# Patient Record
Sex: Female | Born: 1955 | Race: Black or African American | Hispanic: No | Smoking: Former smoker
Health system: Southern US, Community
[De-identification: ages and names within clinical notes are randomized; demographics above are authoritative.]

## PROBLEM LIST (undated history)

## (undated) DIAGNOSIS — K219 Gastro-esophageal reflux disease without esophagitis: Secondary | ICD-10-CM

## (undated) DIAGNOSIS — F329 Major depressive disorder, single episode, unspecified: Secondary | ICD-10-CM

## (undated) DIAGNOSIS — Z9289 Personal history of other medical treatment: Secondary | ICD-10-CM

## (undated) DIAGNOSIS — M797 Fibromyalgia: Secondary | ICD-10-CM

## (undated) DIAGNOSIS — F32A Depression, unspecified: Secondary | ICD-10-CM

## (undated) HISTORY — PX: OTHER SURGICAL HISTORY: SHX169

## (undated) HISTORY — PX: CHOLECYSTECTOMY: SHX55

## (undated) HISTORY — PX: ABDOMINAL HYSTERECTOMY: SHX81

---

## 1998-03-03 ENCOUNTER — Ambulatory Visit (HOSPITAL_COMMUNITY): Admission: RE | Admit: 1998-03-03 | Discharge: 1998-03-03 | Payer: Self-pay | Admitting: *Deleted

## 2000-11-20 ENCOUNTER — Encounter: Payer: Self-pay | Admitting: Emergency Medicine

## 2000-11-20 ENCOUNTER — Emergency Department (HOSPITAL_COMMUNITY): Admission: EM | Admit: 2000-11-20 | Discharge: 2000-11-20 | Payer: Self-pay | Admitting: Emergency Medicine

## 2004-10-28 ENCOUNTER — Emergency Department (HOSPITAL_COMMUNITY): Admission: EM | Admit: 2004-10-28 | Discharge: 2004-10-29 | Payer: Self-pay | Admitting: Emergency Medicine

## 2005-06-14 ENCOUNTER — Other Ambulatory Visit: Admission: RE | Admit: 2005-06-14 | Discharge: 2005-06-14 | Payer: Self-pay | Admitting: Obstetrics and Gynecology

## 2005-06-20 ENCOUNTER — Inpatient Hospital Stay (HOSPITAL_COMMUNITY): Admission: RE | Admit: 2005-06-20 | Discharge: 2005-06-22 | Payer: Self-pay | Admitting: Obstetrics and Gynecology

## 2005-06-20 ENCOUNTER — Encounter (INDEPENDENT_AMBULATORY_CARE_PROVIDER_SITE_OTHER): Payer: Self-pay | Admitting: *Deleted

## 2005-10-28 ENCOUNTER — Emergency Department (HOSPITAL_COMMUNITY): Admission: EM | Admit: 2005-10-28 | Discharge: 2005-10-28 | Payer: Self-pay | Admitting: Emergency Medicine

## 2005-12-13 ENCOUNTER — Emergency Department (HOSPITAL_COMMUNITY): Admission: EM | Admit: 2005-12-13 | Discharge: 2005-12-13 | Payer: Self-pay | Admitting: Emergency Medicine

## 2005-12-19 ENCOUNTER — Encounter: Admission: RE | Admit: 2005-12-19 | Discharge: 2005-12-19 | Payer: Self-pay | Admitting: Gastroenterology

## 2006-01-30 ENCOUNTER — Encounter: Admission: RE | Admit: 2006-01-30 | Discharge: 2006-01-30 | Payer: Self-pay | Admitting: Obstetrics and Gynecology

## 2007-06-13 ENCOUNTER — Encounter: Admission: RE | Admit: 2007-06-13 | Discharge: 2007-06-13 | Payer: Self-pay | Admitting: Gastroenterology

## 2008-01-30 ENCOUNTER — Emergency Department (HOSPITAL_COMMUNITY): Admission: EM | Admit: 2008-01-30 | Discharge: 2008-01-30 | Payer: Self-pay | Admitting: Emergency Medicine

## 2011-01-07 ENCOUNTER — Emergency Department (HOSPITAL_COMMUNITY): Payer: BC Managed Care – PPO

## 2011-01-07 ENCOUNTER — Emergency Department (HOSPITAL_COMMUNITY)
Admission: EM | Admit: 2011-01-07 | Discharge: 2011-01-07 | Disposition: A | Discharge status: Home / Self Care | Payer: BC Managed Care – PPO | Attending: Emergency Medicine | Admitting: Emergency Medicine

## 2011-01-07 DIAGNOSIS — M25469 Effusion, unspecified knee: Secondary | ICD-10-CM | POA: Insufficient documentation

## 2011-01-07 DIAGNOSIS — M25569 Pain in unspecified knee: Secondary | ICD-10-CM | POA: Insufficient documentation

## 2011-02-19 ENCOUNTER — Other Ambulatory Visit: Payer: Self-pay | Admitting: Orthopedic Surgery

## 2011-02-19 DIAGNOSIS — M545 Low back pain: Secondary | ICD-10-CM

## 2011-02-26 ENCOUNTER — Other Ambulatory Visit: Payer: BC Managed Care – PPO

## 2011-04-06 NOTE — H&P (Signed)
[name], [name]              ACCOUNT NO.:  [id number]   MEDICAL RECORD NO.:  [id number]          PATIENT TYPE:  INP   LOCATION:  NA                            [facility]  [facility]   PHYSICIAN:  Dois Davenport A. Rivard, M.D. DATE OF BIRTH:  [birth year]   DATE OF ADMISSION:  06/20/2005  DATE OF DISCHARGE:                                HISTORY & PHYSICAL   REASON FOR ADMISSION:  Symptomatic uterine fibroids.   HISTORY OF PRESENT ILLNESS:  This is a 55 year old single black female who  is seen by me with complaints of worsening uterine fibroids.  She currently  reports menstrual cycles every 21-30 days, with a duration of five to seven  days and heavy flow for four days.  That flow requires two pads every 1-1/2  hours, and she reports passing clots as large as 4 cm with gushes of  menstrual flow.  She also is complaining of severe dysmenorrhea, intensity  8/10, which requires ibuprofen 600 mg every fours.  She denies any  breakthrough bleeding, dyspareunia or postcoital bleeding.  She also is  reporting low back pain that is worsening over the last few months and has  urinary frequency but denies any urinary stress incontinence.  She complains  of low bladder capacity, which also has been worsening.   An ultrasound performed at [facility] in November 2005 revealed  an overall uterine size of 19 x 12 cm.  There was a large, partially  necrotic fundal fibroid measuring 6 cm, and a peripheral right-sided fundal  fibroid measuring 7 cm.  Another pedunculated right-sided fibroid measured 4  cm.  Both ovaries were normal except for containing small follicles.  There  was no evidence of hydronephrosis.  Lab work performed also at this time  revealed hemoglobin of 12.4, hematocrit decreased at 36.4, urinalysis is  negative, and GC/Chlamydia were negative.   REVIEW OF SYSTEMS:  CONSTITUTIONAL:  Negative.  HEAD, EYES, EARS, NOSE AND  THROAT:  Negative.  CARDIORESPIRATORY:  Negative.   GASTROINTESTINAL:  Negative.  GENITOURINARY:  Negative except for low bladder capacity and  urinary frequency.  PSYCHIATRIC:  Negative.  NEUROLOGIC:  Negative.   PAST MEDICAL HISTORY:  1.  Status post cholecystectomy.  2.  Currently with a left torn rotator cuff, awaiting surgical management.  3.  Status post tubal ligation.   The patient is a gravida 4, para 3, with three previous spontaneous vaginal  deliveries.   DRUG ALLERGIES:  PERCOCET, with severe nausea.   CURRENT MEDICATION USED:  Motrin.   SOCIAL HISTORY:  The patient is a Engineer, civil (consulting).  Lives with her 62 year old son.  Has a 71 and a 82 year old daughter living in [place].  She is an  occasional smoker.   FAMILY HISTORY:  Positive for diabetes, positive for hypertension.  Negative  for feminine cancer.  Negative for colon cancer.   PHYSICAL EXAMINATION:  VITAL SIGNS:  Current weight is 203 pounds for a  height of 5 feet 6-1/2 inches.  Blood pressure 110/80.  HEENT:  Normal.  NECK:  Thyroid not enlarged.  CARDIAC:  Regular rate and rhythm.  CHEST:  Clear.  BREASTS:  Normal.  BACK:  No CVA tenderness.  ABDOMEN:  Firm masses from pelvis to the level of the umbilicus, mainly on  the right side, with some tenderness.  No hepatosplenomegaly.  EXTREMITIES:  Negative.  NEUROLOGIC:  Negative.  PELVIC:  Normal external genitalia.  Normal vagina.  Normal cervix.  Pap  smear is collected on June 14, 2005.  Uterus is increased in size, 20-22  weeks, irregular, tender, mobile.  Adnexa are not felt due to the size of  the uterus, and rectovaginal exam is normal.   ASSESSMENT:  Symptomatic large fibroids with pelvic pain and menorrhagia in  patient desiring surgical management.   PLAN:  The patient will undergo total abdominal hysterectomy with  preservation of ovaries per patient request.  The procedure has been  thoroughly reviewed with Ms. Mcdermott, who understands the procedure as well  as the possible risks, including but not  limited to bleeding, infection,  injury to bowels, bladder or ureters.  Hospital stay and recovery have also  been discussed with Ms. Sosinski.       SAR/MEDQ  D:  06/14/2005  T:  06/14/2005  Job:  [id number]

## 2011-04-06 NOTE — Op Note (Signed)
[name], [name]              ACCOUNT NO.:  [id number]   MEDICAL RECORD NO.:  [id number]          PATIENT TYPE:  INP   LOCATION:  9399                          [facility]ACILITY:  [facility]   PHYSICIAN:  Crist Fat. Rivard, M.D. DATE OF BIRTH:  10-01-1956   DATE OF PROCEDURE:  06/20/2005  DATE OF DISCHARGE:                                 OPERATIVE REPORT   PREOPERATIVE DIAGNOSIS:  Uterine fibroids with pelvic pain.   POSTOPERATIVE DIAGNOSIS:  Uterine fibroids and endometriosis.   ANESTHESIA:  General.   PROCEDURE:  Total abdominal hysterectomy.   SURGEON:  Crist Fat. Rivard, M.D.   ASSISTANTMarquis Lunch. Lowell Guitar, P.A.   ESTIMATED BLOOD LOSS:  200 mL.   DESCRIPTION OF PROCEDURE:  After being informed of the planned procedure  with possible complications including bleeding, infection, injury to bowel,  bladder.  Informed consent was obtained.  The patient was taken to OR#3  given general anesthesia with endotracheal intubation without complications.  She was placed in the dorsal decubitus position, prepped and draped in a  sterile fashion, and a Foley catheter was inserted in her bladder.  She  received Ancef 2 grams IV.   The suprapubic area was infiltrated with 20 mL of Marcaine 0.25% and we  performed a Pfannenstiel incision which was brought down sharply to the  fascia.  The fascia was then incised in a Pfannenstiel way.  Linea alba was  dissected and peritoneum was entered bluntly and extended medially.  At this  time, we noted a fascial defect in the umbilical area which we will repair  upon closure.  Also noted is a small adhesion between the omentum and the  anterior fascia which is sectioned with cautery.   Observation:  Appendix is normal.  Liver is smooth.  No retroperitoneal  lymph nodes.  The uterus is bulky, 20 to 22 weeks in size with multiple  pedunculated fibroids.  Both ovaries appear normal, although displaced by  the enlarged uterus.  Anterior and posterior cul-de-sac  feels smooth.   The uterus is easily exteriorized.  Self-retaining retractors are placed and  bowels are packed with abdominal packs.   We are able to suture with a transfixing suture of 0 Vicryl, both round  ligaments and section them.  This allows [place] opening of the broad ligament  and blunt dissection downward of the bladder.  This is achieved easily.   We then freed the adnexa from the uterus by clamping the utero-ovarian  ligament with tube using Rogers forceps.  These pedicles are sectioned and  sutured with the transfixing suture of 0 Vicryl.  We are able to then  skeletonize the uterine vessels with Metzenbaum scissors and clamp those  vessels with Rogers forceps after identifying location of both ureters away  from the site of clamping.  Those pedicles are sectioned and sutured with 0  Vicryl.  A small amount of cardinal ligament is then clamped on each side,  sectioned, and sutured with double suturing the uterine pedicles using 0  Vicryl.  We are then able to remove the body of the uterus from the  cervix  which allows [place] a much better evaluation of the pelvic cavity.  The bladder  is pushed back down somewhat more.  Cardinal ligaments are isolated with  Rogers forceps, sectioned, and sutured with a transfixing suture of 0  Vicryl.  Uterosacral ligaments are identified, clamped with Rogers clamp,  sectioned, and sutured with a transfixing suture of 0 Vicryl, checked for  future suspension.  The vagina is then entered and the cervix is removed  completely using Satinsky scissors.  Both vaginal angles are then sutured  with 0 Vicryl attached to the corresponding uterosacral ligament for  suspension.  Anterior and posterior vaginal edges are then sutured  hemostatically with a running locked suture of 0 Vicryl and the vagina is  closed with a figure-of-eight stitch of 0 Vicryl.  All pedicles are then  systematically inspected.  Hemostasis is completed on the right tubo-ovarian   ligament with a free suture of 0 Vicryl.  Both ureters are identified  visually, appear normal with normal peristalsis.  We then irrigate profusely  the pelvis with warm saline.  Hemostasis is rechecked and adequate.  All  sponges are removed.  Retractors are removed.  Under fascia hemostasis is  completed with the cautery.  Umbilical defect is closed with four  interrupted sutures of 0 Vicryl.  The fascia is then closed with two running  locked suture of 1 Vicryl meeting in the midline.  The wound is irrigated  with warm saline and the skin is closed with a subcuticular suture of 3-0  Monocryl and Steri-Strips.   Needle, sponge, and instrument counts correct x2.  Estimated blood loss is  200 mL.  The procedure is very well tolerated by the patient who is taken to  the recovery room in a well and stable condition.       SAR/MEDQ  D:  06/20/2005  T:  06/20/2005  Job:  [id number]

## 2011-04-06 NOTE — Discharge Summary (Signed)
[name], STANBACK              ACCOUNT NO.:  [id number]   MEDICAL RECORD NO.:  [id number]          PATIENT TYPE:  INP   LOCATION:  9315                          [facility]ILITY:  [facility]   PHYSICIAN:  Crist Fat. Rivard, M.D. DATE OF BIRTH:  1955/12/15   DATE OF ADMISSION:  06/20/2005  DATE OF DISCHARGE:  06/22/2005                                 DISCHARGE SUMMARY   DISCHARGE DIAGNOSES:  1.  Symptomatic uterine fibroids.  2.  Pelvic pain.  3.  Menorrhagia.  4.  Pelvic adhesions.  5.  Umbilical hernia.  6.  Probable endometriosis.  7.  Anxiety.   OPERATION:  On the day of admission, the patient underwent a total abdominal  hysterectomy with lysis of adhesions and repair of an umbilical hernia,  tolerating procedure well.  The patient was found to have a multiple fibroid  uterus, weighing 1077 grams, along with a left paratubal cyst, and normal-  appearing right tube, right ovary, and left ovary.  The patient also was  found to have an approximately 2-cm subumbilical abdominal wall defect as  well as stigmata consistent with endometriosis on the anterior portion of  her uterus.   HISTORY OF PRESENT ILLNESS:  Ms. [name] is a 55 year old, single, black  female who presents for hysterectomy because of symptomatic uterine fibroids  and pelvic pain.  Please see the patient's dictated history and physical  examination for details.   PREOPERATIVE PHYSICAL EXAM:  VITAL SIGNS:  Blood pressure 110/80, weight is  203 pounds, height is 5 feet, 6.5 inches tall.  GENERAL:  Within normal limits.  ABDOMEN:  The patient had a firm mass which was arising from the pelvis to  the level of umbilicus, primarily on the right side with some tenderness.  However, there was no organomegaly.  PELVIC:  Normal external genitalia.  Normal vagina.  Normal cervix.  Uterus  was increased to 20-22 weeks size, irregular, tender, and mobile.  Adnexa  was not felt due to the size of the uterus and rectovaginal exam is  normal.   HOSPITAL COURSE:  On the day of admission, the patient underwent  aforementioned procedures, tolerating them well.  Postoperative course was  unremarkable with the exception of the patient experiencing a single episode  of an anxiety attack on post-op day #1. The patient's episode resolved  spontaneously after approximately 15 minutes, and the patient was free of  this activity for the remainder of her hospital stay.  Post-op hemoglobin was 10.7 (pre-op hemoglobin 12.6).  By post-op day #2,  the patient had resumed bowel and bladder function and therefore, was deemed  ready for discharge home.   DISCHARGE MEDICATIONS:  1.  Ferrous sulfate 325 mg, one tablet twice daily for 6 weeks.  2.  Ibuprofen 600 mg, one tablet with food every 6 hours for three days,      then as needed for pain.  3.  Colace 100 mg, one tablet twice daily until bowel movements are regular.  4.  Phenergan 25 mg, one tablet every 6 hours as needed for nausea.  5.  Dilaudid 2 mg, 1-2  tablets every 4-6 hours as needed for severe pain.  6.  Xanax 0.5 mg, one tablet three times daily as needed.   FOLLOW-UP:  The patient is scheduled for six-week postoperative visit with  Dr. Estanislado Pandy on July 31, 2005 at 1:45 p.m.   DISCHARGE INSTRUCTIONS:  1.  The patient was given a copy of [facility] postoperative      instruction sheet.  2.  She was further advised to avoid driving for two weeks, heavy lifting      for four weeks, and intercourse for six weeks.  3.  The patient was also advised to follow up with her family physician for      her anxiety episodes.   DIET:  Without restriction.   FINAL PATHOLOGY:  Not available at the time of the patient's discharge.       EJP/MEDQ  D:  06/22/2005  T:  06/22/2005  Job:  [id number]

## 2012-12-10 DIAGNOSIS — Z9289 Personal history of other medical treatment: Secondary | ICD-10-CM

## 2012-12-10 HISTORY — DX: Personal history of other medical treatment: Z92.89

## 2013-03-17 ENCOUNTER — Emergency Department (HOSPITAL_COMMUNITY)
Admission: EM | Admit: 2013-03-17 | Discharge: 2013-03-17 | Disposition: A | Discharge status: Home / Self Care | Payer: 59 | Attending: Emergency Medicine | Admitting: Emergency Medicine

## 2013-03-17 ENCOUNTER — Encounter (HOSPITAL_COMMUNITY): Payer: Self-pay | Admitting: Emergency Medicine

## 2013-03-17 ENCOUNTER — Emergency Department (HOSPITAL_COMMUNITY): Payer: 59

## 2013-03-17 DIAGNOSIS — F3289 Other specified depressive episodes: Secondary | ICD-10-CM | POA: Insufficient documentation

## 2013-03-17 DIAGNOSIS — F329 Major depressive disorder, single episode, unspecified: Secondary | ICD-10-CM | POA: Insufficient documentation

## 2013-03-17 DIAGNOSIS — IMO0001 Reserved for inherently not codable concepts without codable children: Secondary | ICD-10-CM | POA: Insufficient documentation

## 2013-03-17 DIAGNOSIS — K219 Gastro-esophageal reflux disease without esophagitis: Secondary | ICD-10-CM | POA: Insufficient documentation

## 2013-03-17 DIAGNOSIS — F17200 Nicotine dependence, unspecified, uncomplicated: Secondary | ICD-10-CM | POA: Insufficient documentation

## 2013-03-17 DIAGNOSIS — Z79899 Other long term (current) drug therapy: Secondary | ICD-10-CM | POA: Insufficient documentation

## 2013-03-17 DIAGNOSIS — F411 Generalized anxiety disorder: Secondary | ICD-10-CM | POA: Insufficient documentation

## 2013-03-17 DIAGNOSIS — R079 Chest pain, unspecified: Secondary | ICD-10-CM

## 2013-03-17 DIAGNOSIS — Z91040 Latex allergy status: Secondary | ICD-10-CM | POA: Insufficient documentation

## 2013-03-17 DIAGNOSIS — R0789 Other chest pain: Secondary | ICD-10-CM | POA: Insufficient documentation

## 2013-03-17 HISTORY — DX: Fibromyalgia: M79.7

## 2013-03-17 HISTORY — DX: Depression, unspecified: F32.A

## 2013-03-17 HISTORY — DX: Major depressive disorder, single episode, unspecified: F32.9

## 2013-03-17 LAB — CBC
Hemoglobin: 12.3 g/dL (ref 12.0–15.0)
MCH: 29.2 pg (ref 26.0–34.0)
MCV: 86.5 fL (ref 78.0–100.0)
Platelets: 336 10*3/uL (ref 150–400)
RDW: 13 % (ref 11.5–15.5)

## 2013-03-17 LAB — POCT I-STAT TROPONIN I: Troponin i, poc: 0 ng/mL (ref 0.00–0.08)

## 2013-03-17 LAB — COMPREHENSIVE METABOLIC PANEL
CO2: 31 mEq/L (ref 19–32)
Chloride: 101 mEq/L (ref 96–112)
GFR calc Af Amer: >90 mL/min (ref >90)
GFR calc non Af Amer: >90 mL/min (ref >90)
Glucose, Bld: 90 mg/dL (ref 70–99)
Sodium: 139 mEq/L (ref 135–145)

## 2013-03-17 MED ORDER — OMEPRAZOLE 20 MG PO CPDR: 20.0000 mg | DELAYED_RELEASE_CAPSULE | Freq: Every day | ORAL | Status: DC

## 2013-03-17 NOTE — ED Notes (Signed)
Stopped taking her bp meds about 2-3 months ago dr is aware she states and she started to take a wt loss supp also

## 2013-03-17 NOTE — ED Notes (Signed)
This am woke up w/ bad h/a and then she lasd down woke up and her bp was high and then she had cpressure. Left arm tingled Took a xaxax  Now pain is a 2

## 2013-03-17 NOTE — ED Provider Notes (Signed)
I saw and evaluated the patient, reviewed the resident's note and I agree with the findings and plan. On my exam this patient was in no distress.  The patient is minimal risk factors for ACS, and in the emergency department had unremarkable ECG, 2 negative troponins. She improved while here as well. The patient endorses noncompliance with Prilosec, which is likely contributory to this presentation.  She was counseled on the need to resume this medication.  She was discharged in stable condition.  I saw the ECG, relevant labs and studies - I agree with the interpretation.   Gerhard Munch, MD 03/17/13 2008

## 2013-03-17 NOTE — ED Provider Notes (Signed)
History     CSN: [id number]  Arrival date & time 03/17/13  1441   First MD Initiated Contact with Patient 03/17/13 1659      Chief Complaint  Patient presents with  . Chest Pain    (Consider location/radiation/quality/duration/timing/severity/associated sxs/prior treatment) Patient is a 57 y.o. female presenting with chest pain. The history is provided by the patient.  Chest Pain Pain location:  Substernal area and L chest Pain quality: aching and dull   Pain radiates to:  L shoulder Pain radiates to the back: no   Pain severity:  Mild Onset quality:  Sudden Duration:  1 minute Timing:  Constant Progression:  Resolved Chronicity:  New Context comment:  Stress, anxiety Associated symptoms: no abdominal pain, no cough, no fever, no palpitations, no shortness of breath and not vomiting     Past Medical History  Diagnosis Date  . Depression   . Fibromyalgia     No past surgical history on file.  No family history on file.  History  Substance Use Topics  . Smoking status: Light Tobacco Smoker  . Smokeless tobacco: Not on file  . Alcohol Use: Not on file    OB History   Grav Para Term Preterm Abortions TAB SAB Ect Mult Living                  Review of Systems  Constitutional: Negative for fever, chills, activity change and appetite change.  HENT: Negative for neck pain and neck stiffness.   Respiratory: Negative for cough, chest tightness, shortness of breath and wheezing.   Cardiovascular: Negative for chest pain, palpitations and leg swelling.  Gastrointestinal: Negative for vomiting, abdominal pain, diarrhea and constipation.  Genitourinary: Negative for dysuria, decreased urine volume and difficulty urinating.  Skin: Negative for rash and wound.  Neurological: Negative for syncope and light-headedness.  Psychiatric/Behavioral: Negative for confusion and agitation.  All other systems reviewed and are negative.    Allergies  Latex and Morphine and  related  Home Medications   Current Outpatient Rx  Name  Route  Sig  Dispense  Refill  . ALPRAZolam (XANAX) 0.25 MG tablet   Oral   Take 0.25 mg by mouth daily as needed for anxiety.         . Cyanocobalamin (VITAMIN B-12 PO)   Oral   Take 1 tablet by mouth daily as needed ("when she feels down").         . DULoxetine (CYMBALTA) 30 MG capsule   Oral   Take 30 mg by mouth 2 (two) times daily.         Marland Kitchen GARCINIA CAMBOGIA-CHROMIUM PO   Oral   Take 1 capsule by mouth 2 (two) times daily.         Marland Kitchen ibuprofen (ADVIL,MOTRIN) 800 MG tablet   Oral   Take 800 mg by mouth 2 (two) times daily as needed for pain.         . Multiple Vitamin (MULTIVITAMIN WITH MINERALS) TABS   Oral   Take 1 tablet by mouth daily.         Marland Kitchen omeprazole (PRILOSEC) 20 MG capsule   Oral   Take 20 mg by mouth daily as needed (acid reflux).           BP 136/74  Pulse 111  Temp(Src) 98.3 F (36.8 C) (Oral)  SpO2 100%  Physical Exam  Nursing note and vitals reviewed. Constitutional: She is oriented to person, place, and time. She appears well-developed and  well-nourished.  HENT:  Head: Normocephalic and atraumatic.  Right Ear: External ear normal.  Left Ear: External ear normal.  Nose: Nose normal.  Mouth/Throat: Oropharynx is clear and moist. No oropharyngeal exudate.  Eyes: Conjunctivae are normal. Pupils are equal, round, and reactive to light.  Neck: Normal range of motion. Neck supple.  Cardiovascular: Normal rate, regular rhythm, normal heart sounds and intact distal pulses.  Exam reveals no gallop and no friction rub.   No murmur heard. Pulmonary/Chest: Effort normal and breath sounds normal. No respiratory distress. She has no wheezes. She has no rales. She exhibits no tenderness.  Abdominal: Soft. Bowel sounds are normal. She exhibits no distension and no mass. There is no tenderness. There is no rebound and no guarding.  Musculoskeletal: Normal range of motion. She exhibits no  edema and no tenderness.  Neurological: She is alert and oriented to person, place, and time. She displays normal reflexes. No cranial nerve deficit. She exhibits normal muscle tone. Coordination normal.  Skin: Skin is warm and dry.  Psychiatric: She has a normal mood and affect. Her behavior is normal. Judgment and thought content normal.    ED Course  Procedures (including critical care time)  Labs Reviewed  CBC - Abnormal; Notable for the following:    WBC 10.6 (*)    All other components within normal limits  COMPREHENSIVE METABOLIC PANEL  POCT I-STAT TROPONIN I  POCT I-STAT TROPONIN I   Dg Chest 2 View  03/17/2013  *RADIOLOGY REPORT*  Clinical Data: Chest pain and dizziness.  Smoker.  CHEST - 2 VIEW  Comparison: Chest CTA dated 10/28/2005.  Findings: Normal sized heart.  Clear lungs with normal vascularity. Mild thoracic spine degenerative changes.  Cholecystectomy clips.  IMPRESSION: No acute abnormality.   Original Report Authenticated By: Beckie Salts, M.D.      1. Chest pain       MDM  57 yo F w/hx of GERD, Fibromyalgia, and Anxiety presents after episode of left-sided chest pressure with radiation to left shoulder while she was thinking about work this morning. She states the pain felt different than her typical GERD and Prilosec did not resolve symptoms. However, symptoms did resolve with 0.5mg  Xanax. Asymptomatic here. EKG not c/w acute ischemia or arrythmia. Serial troponins negative. Clinical picture not concerning for PE, ACS, or aortic dissection. Suspect anxiety as cause of symptoms. Patient given return precautions, including worsening of signs or symptoms. Patient instructed to follow-up with primary care physician.           Clemetine Marker, MD 03/17/13 [phone]

## 2013-03-17 NOTE — ED Notes (Signed)
[name]- logged vitals.

## 2013-12-08 ENCOUNTER — Other Ambulatory Visit: Payer: Self-pay | Admitting: Orthopedic Surgery

## 2013-12-09 ENCOUNTER — Encounter (HOSPITAL_COMMUNITY): Payer: Self-pay | Admitting: Pharmacy Technician

## 2013-12-11 ENCOUNTER — Encounter (HOSPITAL_COMMUNITY): Payer: Self-pay

## 2013-12-11 ENCOUNTER — Encounter (HOSPITAL_COMMUNITY)
Admission: RE | Admit: 2013-12-11 | Discharge: 2013-12-11 | Disposition: A | Discharge status: Home / Self Care | Payer: 59 | Source: Ambulatory Visit | Attending: Orthopedic Surgery | Admitting: Orthopedic Surgery

## 2013-12-11 DIAGNOSIS — Z01812 Encounter for preprocedural laboratory examination: Secondary | ICD-10-CM | POA: Insufficient documentation

## 2013-12-11 DIAGNOSIS — Z01818 Encounter for other preprocedural examination: Secondary | ICD-10-CM | POA: Insufficient documentation

## 2013-12-11 HISTORY — DX: Gastro-esophageal reflux disease without esophagitis: K21.9

## 2013-12-11 HISTORY — DX: Personal history of other medical treatment: Z92.89

## 2013-12-11 LAB — URINALYSIS, ROUTINE W REFLEX MICROSCOPIC
BILIRUBIN URINE: NEGATIVE
Glucose, UA: NEGATIVE mg/dL
Hgb urine dipstick: NEGATIVE
Ketones, ur: NEGATIVE mg/dL
Leukocytes, UA: NEGATIVE
Nitrite: NEGATIVE
Protein, ur: NEGATIVE mg/dL
SPECIFIC GRAVITY, URINE: 1.033 — AB (ref 1.005–1.030)
Urobilinogen, UA: 0.2 mg/dL (ref 0.0–1.0)
pH: 5 (ref 5.0–8.0)

## 2013-12-11 LAB — COMPREHENSIVE METABOLIC PANEL
ALK PHOS: 83 U/L (ref 39–117)
ALT: 30 U/L (ref 0–35)
AST: 20 U/L (ref 0–37)
Albumin: 4 g/dL (ref 3.5–5.2)
BILIRUBIN TOTAL: 0.4 mg/dL (ref 0.3–1.2)
BUN: 11 mg/dL (ref 6–23)
CHLORIDE: 101 meq/L (ref 96–112)
CO2: 25 mEq/L (ref 19–32)
Calcium: 9.2 mg/dL (ref 8.4–10.5)
Creatinine, Ser: 0.64 mg/dL (ref 0.50–1.10)
GFR calc non Af Amer: >90 mL/min (ref >90)
GLUCOSE: 83 mg/dL (ref 70–99)
POTASSIUM: 3.7 meq/L (ref 3.7–5.3)
Sodium: 140 mEq/L (ref 137–147)
Total Protein: 7.4 g/dL (ref 6.0–8.3)

## 2013-12-11 LAB — CBC WITH DIFFERENTIAL/PLATELET
Basophils Absolute: 0 10*3/uL (ref 0.0–0.1)
Basophils Relative: 0 % (ref 0–1)
EOS ABS: 0.1 10*3/uL (ref 0.0–0.7)
Eosinophils Relative: 1 % (ref 0–5)
HCT: 37.9 % (ref 36.0–46.0)
HEMOGLOBIN: 12.8 g/dL (ref 12.0–15.0)
LYMPHS ABS: 3.5 10*3/uL (ref 0.7–4.0)
LYMPHS PCT: 37 % (ref 12–46)
MCH: 29.2 pg (ref 26.0–34.0)
MCHC: 33.8 g/dL (ref 30.0–36.0)
MCV: 86.5 fL (ref 78.0–100.0)
MONOS PCT: 9 % (ref 3–12)
Monocytes Absolute: 0.8 10*3/uL (ref 0.1–1.0)
NEUTROS ABS: 5.1 10*3/uL (ref 1.7–7.7)
NEUTROS PCT: 54 % (ref 43–77)
Platelets: 332 10*3/uL (ref 150–400)
RBC: 4.38 MIL/uL (ref 3.87–5.11)
RDW: 12.6 % (ref 11.5–15.5)
WBC: 9.6 10*3/uL (ref 4.0–10.5)

## 2013-12-11 LAB — ABO/RH: ABO/RH(D): A POS

## 2013-12-11 LAB — SURGICAL PCR SCREEN
MRSA, PCR: POSITIVE — AB
STAPHYLOCOCCUS AUREUS: POSITIVE — AB

## 2013-12-11 LAB — TYPE AND SCREEN
ABO/RH(D): A POS
Antibody Screen: NEGATIVE

## 2013-12-11 LAB — APTT: aPTT: 29 seconds (ref 24–37)

## 2013-12-11 LAB — PROTIME-INR
INR: 1 (ref 0.00–1.49)
Prothrombin Time: 13 seconds (ref 11.6–15.2)

## 2013-12-11 NOTE — Pre-Procedure Instructions (Signed)
Arminda ResidesDonna M Blancett  12/11/2013   Your procedure is scheduled on:  Friday, January 30  [facility] A at 0530 AM.  Call this number if you have problems the morning of surgery: [phone]   Remember:   Do not eat food or drink liquids after midnight.Thursday night   Take these medicines the morning of surgery with A SIP OF WATER: Omeprazole (Zegerid), Oxycodone if needed, Alprazalam (Xanax) if needed   Do not wear jewelry, make-up or nail polish.  Do not wear lotions, powders, or perfumes. You may wear deodorant.  Do not shave 48 hours prior to surgery.   Do not bring valuables to the hospital.  [facility] is not responsible   for any belongings or valuables.               Contacts, dentures or bridgework may not be worn into surgery.  Leave suitcase in the car. After surgery it may be brought to your room.  For patients admitted to the hospital, discharge time is determined by your                treatment team.     Special Instructions: Shower using CHG 2 nights before surgery and the night before surgery.  If you shower the day of surgery use CHG.  Use special wash - you have one bottle of CHG for all showers.  You should use approximately 1/3 of the bottle for each shower.   Please read over the following fact sheets that you were given: Pain Booklet, Coughing and Deep Breathing, Blood Transfusion Information, MRSA Information and Surgical Site Infection Prevention

## 2013-12-17 MED ORDER — CEFAZOLIN SODIUM-DEXTROSE 2-3 GM-% IV SOLR
2.0000 g | INTRAVENOUS | Status: AC
  Administered 2013-12-18: 2 g via INTRAVENOUS
  Filled 2013-12-17: qty 50

## 2013-12-18 ENCOUNTER — Encounter (HOSPITAL_COMMUNITY)
Admission: RE | Disposition: A | Discharge status: Home Health | Payer: Self-pay | Source: Ambulatory Visit | Attending: Orthopedic Surgery

## 2013-12-18 ENCOUNTER — Inpatient Hospital Stay (HOSPITAL_COMMUNITY): Payer: 59 | Admitting: Anesthesiology

## 2013-12-18 ENCOUNTER — Inpatient Hospital Stay (HOSPITAL_COMMUNITY)
Admission: RE | Admit: 2013-12-18 | Discharge: 2013-12-19 | DRG: 470 | Disposition: A | Discharge status: Home Health | Payer: 59 | Source: Ambulatory Visit | Attending: Orthopedic Surgery | Admitting: Orthopedic Surgery

## 2013-12-18 ENCOUNTER — Encounter (HOSPITAL_COMMUNITY): Payer: Self-pay | Admitting: *Deleted

## 2013-12-18 ENCOUNTER — Encounter (HOSPITAL_COMMUNITY): Payer: 59 | Admitting: Anesthesiology

## 2013-12-18 DIAGNOSIS — K219 Gastro-esophageal reflux disease without esophagitis: Secondary | ICD-10-CM | POA: Diagnosis present

## 2013-12-18 DIAGNOSIS — Z889 Allergy status to unspecified drugs, medicaments and biological substances status: Secondary | ICD-10-CM

## 2013-12-18 DIAGNOSIS — M1711 Unilateral primary osteoarthritis, right knee: Secondary | ICD-10-CM | POA: Diagnosis present

## 2013-12-18 DIAGNOSIS — Z6834 Body mass index (BMI) 34.0-34.9, adult: Secondary | ICD-10-CM

## 2013-12-18 DIAGNOSIS — E669 Obesity, unspecified: Secondary | ICD-10-CM | POA: Diagnosis present

## 2013-12-18 DIAGNOSIS — M1710 Unilateral primary osteoarthritis, unspecified knee: Principal | ICD-10-CM | POA: Diagnosis present

## 2013-12-18 DIAGNOSIS — Z79899 Other long term (current) drug therapy: Secondary | ICD-10-CM

## 2013-12-18 DIAGNOSIS — Z01812 Encounter for preprocedural laboratory examination: Secondary | ICD-10-CM

## 2013-12-18 DIAGNOSIS — F329 Major depressive disorder, single episode, unspecified: Secondary | ICD-10-CM | POA: Diagnosis present

## 2013-12-18 DIAGNOSIS — Z91040 Latex allergy status: Secondary | ICD-10-CM

## 2013-12-18 DIAGNOSIS — Z01818 Encounter for other preprocedural examination: Secondary | ICD-10-CM

## 2013-12-18 DIAGNOSIS — IMO0001 Reserved for inherently not codable concepts without codable children: Secondary | ICD-10-CM | POA: Diagnosis present

## 2013-12-18 DIAGNOSIS — F3289 Other specified depressive episodes: Secondary | ICD-10-CM | POA: Diagnosis present

## 2013-12-18 DIAGNOSIS — Z87891 Personal history of nicotine dependence: Secondary | ICD-10-CM

## 2013-12-18 HISTORY — PX: TOTAL KNEE ARTHROPLASTY: SHX125

## 2013-12-18 SURGERY — ARTHROPLASTY, KNEE, TOTAL
Anesthesia: Regional | Site: Knee | Laterality: Right

## 2013-12-18 MED ORDER — ONDANSETRON HCL 4 MG/2ML IJ SOLN
INTRAMUSCULAR | Status: DC | PRN
  Administered 2013-12-18: 4 mg via INTRAVENOUS

## 2013-12-18 MED ORDER — MIDAZOLAM HCL 2 MG/2ML IJ SOLN
INTRAMUSCULAR | Status: AC
  Filled 2013-12-18: qty 2

## 2013-12-18 MED ORDER — ROCURONIUM BROMIDE 100 MG/10ML IV SOLN: INTRAVENOUS | Status: DC | PRN

## 2013-12-18 MED ORDER — SODIUM CHLORIDE 0.9 % IJ SOLN
INTRAMUSCULAR | Status: DC | PRN
  Administered 2013-12-18: 09:00:00

## 2013-12-18 MED ORDER — ASPIRIN EC 325 MG PO TBEC: 325.0000 mg | DELAYED_RELEASE_TABLET | Freq: Two times a day (BID) | ORAL | Status: AC

## 2013-12-18 MED ORDER — PANTOPRAZOLE SODIUM 40 MG PO TBEC: 40.0000 mg | DELAYED_RELEASE_TABLET | Freq: Every day | ORAL | Status: DC

## 2013-12-18 MED ORDER — DIPHENHYDRAMINE HCL 12.5 MG/5ML PO ELIX: 12.5000 mg | ORAL_SOLUTION | ORAL | Status: DC | PRN

## 2013-12-18 MED ORDER — ONDANSETRON HCL 4 MG/2ML IJ SOLN
4.0000 mg | Freq: Once | INTRAMUSCULAR | Status: AC | PRN
  Administered 2013-12-18: 4 mg via INTRAVENOUS

## 2013-12-18 MED ORDER — METHOCARBAMOL 750 MG PO TABS: 750.0000 mg | ORAL_TABLET | Freq: Three times a day (TID) | ORAL | Status: AC | PRN

## 2013-12-18 MED ORDER — LACTATED RINGERS IV SOLN
INTRAVENOUS | Status: DC | PRN
  Administered 2013-12-18: 07:00:00 via INTRAVENOUS

## 2013-12-18 MED ORDER — DOCUSATE SODIUM 100 MG PO CAPS
100.0000 mg | ORAL_CAPSULE | Freq: Two times a day (BID) | ORAL | Status: DC
  Administered 2013-12-18 (×2): 100 mg via ORAL
  Filled 2013-12-18 (×4): qty 1

## 2013-12-18 MED ORDER — ACETAMINOPHEN 650 MG RE SUPP: 650.0000 mg | Freq: Four times a day (QID) | RECTAL | Status: DC | PRN

## 2013-12-18 MED ORDER — OXYCODONE-ACETAMINOPHEN 5-325 MG PO TABS: 1.0000 | ORAL_TABLET | Freq: Four times a day (QID) | ORAL | Status: DC | PRN

## 2013-12-18 MED ORDER — DEXAMETHASONE SODIUM PHOSPHATE 10 MG/ML IJ SOLN
INTRAMUSCULAR | Status: AC
  Filled 2013-12-18: qty 1

## 2013-12-18 MED ORDER — ZOLPIDEM TARTRATE 5 MG PO TABS
5.0000 mg | ORAL_TABLET | Freq: Every evening | ORAL | Status: DC | PRN
  Administered 2013-12-18: 5 mg via ORAL
  Filled 2013-12-18: qty 1

## 2013-12-18 MED ORDER — BUPIVACAINE-EPINEPHRINE PF 0.5-1:200000 % IJ SOLN
INTRAMUSCULAR | Status: DC | PRN
  Administered 2013-12-18: 15 mL via PERINEURAL

## 2013-12-18 MED ORDER — EPHEDRINE SULFATE 50 MG/ML IJ SOLN
INTRAMUSCULAR | Status: DC | PRN
  Administered 2013-12-18: 10 mg via INTRAVENOUS
  Administered 2013-12-18: 5 mg via INTRAVENOUS

## 2013-12-18 MED ORDER — DEXAMETHASONE SODIUM PHOSPHATE 4 MG/ML IJ SOLN
INTRAMUSCULAR | Status: DC | PRN
  Administered 2013-12-18: 12 mg via INTRAVENOUS

## 2013-12-18 MED ORDER — PROPOFOL 10 MG/ML IV BOLUS
INTRAVENOUS | Status: DC | PRN
  Administered 2013-12-18: 50 mg via INTRAVENOUS
  Administered 2013-12-18: 200 mg via INTRAVENOUS

## 2013-12-18 MED ORDER — CHLORHEXIDINE GLUCONATE 4 % EX LIQD: 60.0000 mL | Freq: Once | CUTANEOUS | Status: DC

## 2013-12-18 MED ORDER — TRANEXAMIC ACID 100 MG/ML IV SOLN
1000.0000 mg | INTRAVENOUS | Status: DC
  Filled 2013-12-18: qty 10

## 2013-12-18 MED ORDER — HYDROMORPHONE HCL PF 1 MG/ML IJ SOLN
0.2500 mg | INTRAMUSCULAR | Status: DC | PRN
  Administered 2013-12-18 (×3): 0.5 mg via INTRAVENOUS

## 2013-12-18 MED ORDER — MUPIROCIN 2 % EX OINT
TOPICAL_OINTMENT | Freq: Two times a day (BID) | CUTANEOUS | Status: DC
  Administered 2013-12-18 (×2): via NASAL
  Filled 2013-12-18 (×2): qty 22

## 2013-12-18 MED ORDER — ONDANSETRON HCL 4 MG PO TABS: 4.0000 mg | ORAL_TABLET | Freq: Four times a day (QID) | ORAL | Status: DC | PRN

## 2013-12-18 MED ORDER — POLYETHYLENE GLYCOL 3350 17 G PO PACK: 17.0000 g | PACK | Freq: Every day | ORAL | Status: DC | PRN

## 2013-12-18 MED ORDER — BISACODYL 5 MG PO TBEC: 5.0000 mg | DELAYED_RELEASE_TABLET | Freq: Every day | ORAL | Status: DC | PRN

## 2013-12-18 MED ORDER — HYDROMORPHONE HCL PF 1 MG/ML IJ SOLN: 1.0000 mg | INTRAMUSCULAR | Status: DC | PRN

## 2013-12-18 MED ORDER — ALUM & MAG HYDROXIDE-SIMETH 200-200-20 MG/5ML PO SUSP: 30.0000 mL | ORAL | Status: DC | PRN

## 2013-12-18 MED ORDER — ASPIRIN EC 325 MG PO TBEC
325.0000 mg | DELAYED_RELEASE_TABLET | Freq: Two times a day (BID) | ORAL | Status: DC
  Administered 2013-12-18: 325 mg via ORAL
  Filled 2013-12-18 (×4): qty 1

## 2013-12-18 MED ORDER — FENTANYL CITRATE 0.05 MG/ML IJ SOLN
INTRAMUSCULAR | Status: DC | PRN
  Administered 2013-12-18 (×5): 50 ug via INTRAVENOUS

## 2013-12-18 MED ORDER — DEXAMETHASONE SODIUM PHOSPHATE 4 MG/ML IJ SOLN
INTRAMUSCULAR | Status: AC
  Filled 2013-12-18: qty 1

## 2013-12-18 MED ORDER — ONDANSETRON HCL 4 MG/2ML IJ SOLN: 4.0000 mg | Freq: Four times a day (QID) | INTRAMUSCULAR | Status: DC | PRN

## 2013-12-18 MED ORDER — ONDANSETRON HCL 4 MG/2ML IJ SOLN
INTRAMUSCULAR | Status: AC
  Filled 2013-12-18: qty 2

## 2013-12-18 MED ORDER — LIDOCAINE HCL (CARDIAC) 20 MG/ML IV SOLN
INTRAVENOUS | Status: DC | PRN
  Administered 2013-12-18: 1000 mg via INTRAVENOUS

## 2013-12-18 MED ORDER — METHOCARBAMOL 100 MG/ML IJ SOLN
500.0000 mg | Freq: Four times a day (QID) | INTRAVENOUS | Status: DC | PRN
  Administered 2013-12-18: 500 mg via INTRAVENOUS
  Filled 2013-12-18: qty 5

## 2013-12-18 MED ORDER — DEXAMETHASONE SODIUM PHOSPHATE 4 MG/ML IJ SOLN
INTRAMUSCULAR | Status: AC
  Filled 2013-12-18: qty 2

## 2013-12-18 MED ORDER — DEXAMETHASONE SODIUM PHOSPHATE 10 MG/ML IJ SOLN
10.0000 mg | Freq: Three times a day (TID) | INTRAMUSCULAR | Status: AC
  Administered 2013-12-18: 10 mg via INTRAVENOUS
  Filled 2013-12-18 (×3): qty 1

## 2013-12-18 MED ORDER — ACETAMINOPHEN 325 MG PO TABS: 650.0000 mg | ORAL_TABLET | Freq: Four times a day (QID) | ORAL | Status: DC | PRN

## 2013-12-18 MED ORDER — PROMETHAZINE HCL 25 MG/ML IJ SOLN: 12.5000 mg | Freq: Four times a day (QID) | INTRAMUSCULAR | Status: DC | PRN

## 2013-12-18 MED ORDER — SODIUM CHLORIDE 0.9 % IR SOLN
Status: DC | PRN
  Administered 2013-12-18: 3000 mL

## 2013-12-18 MED ORDER — HYDROMORPHONE HCL PF 1 MG/ML IJ SOLN
INTRAMUSCULAR | Status: AC
  Filled 2013-12-18: qty 1

## 2013-12-18 MED ORDER — PROPOFOL 10 MG/ML IV BOLUS
INTRAVENOUS | Status: AC
  Filled 2013-12-18: qty 20

## 2013-12-18 MED ORDER — TRANEXAMIC ACID 100 MG/ML IV SOLN
1000.0000 mg | INTRAVENOUS | Status: DC | PRN
  Administered 2013-12-18: 1000 mg via INTRAVENOUS

## 2013-12-18 MED ORDER — KETOROLAC TROMETHAMINE 15 MG/ML IJ SOLN
15.0000 mg | Freq: Four times a day (QID) | INTRAMUSCULAR | Status: AC
  Administered 2013-12-18 – 2013-12-19 (×4): 15 mg via INTRAVENOUS
  Filled 2013-12-18 (×4): qty 1

## 2013-12-18 MED ORDER — METHOCARBAMOL 500 MG PO TABS: 500.0000 mg | ORAL_TABLET | Freq: Four times a day (QID) | ORAL | Status: DC | PRN

## 2013-12-18 MED ORDER — ARTIFICIAL TEARS OP OINT
TOPICAL_OINTMENT | OPHTHALMIC | Status: AC
  Filled 2013-12-18: qty 3.5

## 2013-12-18 MED ORDER — FENTANYL CITRATE 0.05 MG/ML IJ SOLN
INTRAMUSCULAR | Status: AC
  Filled 2013-12-18: qty 5

## 2013-12-18 MED ORDER — EPHEDRINE SULFATE 50 MG/ML IJ SOLN
INTRAMUSCULAR | Status: AC
  Filled 2013-12-18: qty 1

## 2013-12-18 MED ORDER — SODIUM CHLORIDE 0.9 % IV SOLN
INTRAVENOUS | Status: DC
  Administered 2013-12-18: 23:00:00 via INTRAVENOUS

## 2013-12-18 MED ORDER — CEFAZOLIN SODIUM-DEXTROSE 2-3 GM-% IV SOLR
2.0000 g | Freq: Four times a day (QID) | INTRAVENOUS | Status: AC
  Administered 2013-12-18 (×2): 2 g via INTRAVENOUS
  Filled 2013-12-18 (×2): qty 50

## 2013-12-18 MED ORDER — ALPRAZOLAM 0.25 MG PO TABS: 0.2500 mg | ORAL_TABLET | Freq: Every day | ORAL | Status: DC | PRN

## 2013-12-18 MED ORDER — DEXAMETHASONE 6 MG PO TABS
10.0000 mg | ORAL_TABLET | Freq: Three times a day (TID) | ORAL | Status: AC
  Administered 2013-12-18 – 2013-12-19 (×2): 10 mg via ORAL
  Filled 2013-12-18 (×3): qty 1

## 2013-12-18 MED ORDER — OXYCODONE-ACETAMINOPHEN 5-325 MG PO TABS
1.0000 | ORAL_TABLET | ORAL | Status: DC | PRN
  Filled 2013-12-18: qty 2

## 2013-12-18 SURGICAL SUPPLY — 60 items
APL SKNCLS STERI-STRIP NONHPOA (GAUZE/BANDAGES/DRESSINGS) ×1
BANDAGE ESMARK 6X9 LF (GAUZE/BANDAGES/DRESSINGS) ×1 IMPLANT
BENZOIN TINCTURE PRP APPL 2/3 (GAUZE/BANDAGES/DRESSINGS) ×3 IMPLANT
BLADE SAGITTAL 25.0X1.19X90 (BLADE) ×2 IMPLANT
BLADE SAGITTAL 25.0X1.19X90MM (BLADE) ×1
BLADE SAW SAG 90X13X1.27 (BLADE) ×3 IMPLANT
BNDG CMPR 9X6 STRL LF SNTH (GAUZE/BANDAGES/DRESSINGS) ×1
BNDG ESMARK 6X9 LF (GAUZE/BANDAGES/DRESSINGS) ×3
BOWL SMART MIX CTS (DISPOSABLE) ×3 IMPLANT
CAPT RP KNEE ×2 IMPLANT
CEMENT HV SMART SET (Cement) ×6 IMPLANT
CLOSURE WOUND 1/2 X4 (GAUZE/BANDAGES/DRESSINGS) ×1
CLOTH BEACON ORANGE TIMEOUT ST (SAFETY) ×3 IMPLANT
COVER SURGICAL LIGHT HANDLE (MISCELLANEOUS) ×3 IMPLANT
CUFF TOURNIQUET SINGLE 34IN LL (TOURNIQUET CUFF) ×3 IMPLANT
CUFF TOURNIQUET SINGLE 44IN (TOURNIQUET CUFF) IMPLANT
DRAPE EXTREMITY T 121X128X90 (DRAPE) ×3 IMPLANT
DRAPE U-SHAPE 47X51 STRL (DRAPES) ×3 IMPLANT
DRSG PAD ABDOMINAL 8X10 ST (GAUZE/BANDAGES/DRESSINGS) ×3 IMPLANT
DURAPREP 26ML APPLICATOR (WOUND CARE) ×3 IMPLANT
ELECT REM PT RETURN 9FT ADLT (ELECTROSURGICAL) ×3
ELECTRODE REM PT RTRN 9FT ADLT (ELECTROSURGICAL) ×1 IMPLANT
EVACUATOR 1/8 PVC DRAIN (DRAIN) ×3 IMPLANT
FACESHIELD LNG OPTICON STERILE (SAFETY) ×3 IMPLANT
GAUZE XEROFORM 5X9 LF (GAUZE/BANDAGES/DRESSINGS) ×3 IMPLANT
GLOVE BIOGEL PI IND STRL 8 (GLOVE) ×2 IMPLANT
GLOVE BIOGEL PI INDICATOR 8 (GLOVE) ×4
GLOVE ECLIPSE 7.5 STRL STRAW (GLOVE) ×2 IMPLANT
GOWN PREVENTION PLUS LG XLONG (DISPOSABLE) IMPLANT
GOWN STRL NON-REIN LRG LVL3 (GOWN DISPOSABLE) ×3 IMPLANT
GOWN STRL REIN XL XLG (GOWN DISPOSABLE) ×6 IMPLANT
HANDPIECE INTERPULSE COAX TIP (DISPOSABLE) ×3
HOOD PEEL AWAY FACE SHEILD DIS (HOOD) ×7 IMPLANT
IMMOBILIZER KNEE 20 (SOFTGOODS) IMPLANT
IMMOBILIZER KNEE 22 UNIV (SOFTGOODS) ×3 IMPLANT
KIT BASIN OR (CUSTOM PROCEDURE TRAY) ×3 IMPLANT
KIT ROOM TURNOVER OR (KITS) ×3 IMPLANT
MANIFOLD NEPTUNE II (INSTRUMENTS) ×3 IMPLANT
NDL HYPO 25GX1X1/2 BEV (NEEDLE) IMPLANT
NEEDLE HYPO 25GX1X1/2 BEV (NEEDLE) ×3 IMPLANT
NS IRRIG 1000ML POUR BTL (IV SOLUTION) ×3 IMPLANT
PACK TOTAL JOINT (CUSTOM PROCEDURE TRAY) ×3 IMPLANT
PAD ARMBOARD 7.5X6 YLW CONV (MISCELLANEOUS) ×6 IMPLANT
PAD CAST 4YDX4 CTTN HI CHSV (CAST SUPPLIES) ×1 IMPLANT
PADDING CAST COTTON 4X4 STRL (CAST SUPPLIES) ×3
SET HNDPC FAN SPRY TIP SCT (DISPOSABLE) ×1 IMPLANT
SPONGE GAUZE 4X4 12PLY (GAUZE/BANDAGES/DRESSINGS) ×3 IMPLANT
STAPLER VISISTAT 35W (STAPLE) IMPLANT
STRIP CLOSURE SKIN 1/2X4 (GAUZE/BANDAGES/DRESSINGS) ×2 IMPLANT
SUCTION FRAZIER TIP 10 FR DISP (SUCTIONS) ×3 IMPLANT
SUT MNCRL AB 3-0 PS2 18 (SUTURE) IMPLANT
SUT VIC AB 0 CTB1 27 (SUTURE) ×6 IMPLANT
SUT VIC AB 1 CT1 27 (SUTURE) ×6
SUT VIC AB 1 CT1 27XBRD ANBCTR (SUTURE) ×2 IMPLANT
SUT VIC AB 2-0 CTB1 (SUTURE) ×6 IMPLANT
SYR CONTROL 10ML LL (SYRINGE) IMPLANT
TOWEL OR 17X24 6PK STRL BLUE (TOWEL DISPOSABLE) ×3 IMPLANT
TOWEL OR 17X26 10 PK STRL BLUE (TOWEL DISPOSABLE) ×3 IMPLANT
TRAY FOLEY CATH 16FRSI W/METER (SET/KITS/TRAYS/PACK) ×3 IMPLANT
WATER STERILE IRR 1000ML POUR (IV SOLUTION) ×6 IMPLANT

## 2013-12-18 NOTE — Preoperative (Signed)
Beta Blockers   Reason not to administer Beta Blockers:Not Applicable 

## 2013-12-18 NOTE — Care Management Note (Signed)
CARE MANAGEMENT NOTE 12/18/2013  Patient:  [name],[name]   Account Number:  [id number]  Date Initiated:  12/18/2013  Documentation initiated by:  Vance PeperBRADY,Chue Berkovich  Subjective/Objective Assessment:   [age]yr old female s/p right total knee arthroplasty.     Action/Plan:   Case Manager spoke with patient concerning Home Health and DME needs at discharge.Patient states she was pre[facility], no changes. DME has been delivered.has family support at discharge.   Anticipated DC Date:  12/19/2013   Anticipated DC Plan:  HOME W HOME HEALTH SERVICES      DC Planning Services  CM consult      PAC Choice  DURABLE MEDICAL EQUIPMENT  HOME HEALTH   Choice offered to / List presented to:  C-1 Patient   DME arranged  [facility]  [facility]  3-N-1      DME agency  [employer]     HH arranged  HH-2 PT      HH [facility]   Status of service:  Completed, signed off

## 2013-12-18 NOTE — Evaluation (Signed)
Physical Therapy Evaluation Patient Details Name: [name] MRN: [id number] DOB: [birth year] Today's Date: 12/18/2013 Time: 4098-1191 PT Time Calculation (min): 20 min  PT Assessment / Plan / Recommendation History of Present Illness  R TKA  Clinical Impression  *Pt is s/p TKA resulting in the deficits listed below (see PT Problem List). ** Pt will benefit from skilled PT to increase their independence and safety with mobility to allow discharge to the venue listed below. **    PT Assessment  Patient needs continued PT services    Follow Up Recommendations  Home health PT    Does the patient have the potential to tolerate intense rehabilitation      Barriers to Discharge        Equipment Recommendations  None recommended by PT    Recommendations for Other Services OT consult   Frequency 7X/week    Precautions / Restrictions     Pertinent Vitals/Pain *0/10 pain with activity premedicated**      Mobility  Bed Mobility Overal bed mobility: Needs Assistance Bed Mobility: Supine to Sit Supine to sit: Min assist General bed mobility comments: min A to support RLE Transfers Overall transfer level: Needs assistance Equipment used: Rolling walker (2 wheeled) Transfers: Sit to/from Stand Sit to Stand: Min assist General transfer comment: min A to steady, VCs hand placement Ambulation/Gait Ambulation/Gait assistance: Min guard Ambulation Distance (Feet): 20 Feet Assistive device: Rolling walker (2 wheeled) Gait Pattern/deviations: Step-to pattern Gait velocity: decr 2* pain Gait velocity interpretation: Below normal speed for age/gender General Gait Details: VCs sequencing, steady    Exercises Total Joint Exercises Ankle Circles/Pumps: AROM;Both;10 reps;Supine Quad Sets: AROM;Both;5 reps;Supine Heel Slides: AAROM;Right;10 reps;Supine Goniometric ROM: AAROM 80* flexion R knee   PT Diagnosis: Difficulty walking;Acute pain  PT Problem List: Decreased  strength;Decreased range of motion;Decreased activity tolerance;Pain;Decreased mobility PT Treatment Interventions: Functional mobility training;Stair training;Gait training;DME instruction;Therapeutic activities;Therapeutic exercise;Patient/family education     PT Goals(Current goals can be found in the care plan section) Acute Rehab PT Goals Patient Stated Goal: to travel PT Goal Formulation: With patient Time For Goal Achievement: 12/22/13 Potential to Achieve Goals: Good  Visit Information  Last PT Received On: 12/18/13 Assistance Needed: +1 History of Present Illness: R TKA       Prior Functioning  Home Living Family/patient expects to be discharged to:: Private residence Living Arrangements: Spouse/significant other;Children Available Help at Discharge: Family Type of Home: House Home Access: Stairs to enter Secretary/administrator of Steps: 2 Entrance Stairs-Rails: None Home Layout: Two level;Bed/bath upstairs Alternate Level Stairs-Number of Steps: 15 Alternate Level Stairs-Rails: Right Home Equipment: Walker - 2 wheels;Bedside commode Prior Function Level of Independence: Independent Communication Communication: No difficulties    Cognition  Cognition Arousal/Alertness: Awake/alert Behavior During Therapy: WFL for tasks assessed/performed Overall Cognitive Status: Within Functional Limits for tasks assessed    Extremity/Trunk Assessment Upper Extremity Assessment Upper Extremity Assessment: Overall WFL for tasks assessed Lower Extremity Assessment Lower Extremity Assessment: RLE deficits/detail RLE Deficits / Details: R knee flexion AAROM 80*, SLR 2/5, ankle WNL Cervical / Trunk Assessment Cervical / Trunk Assessment: Normal   Balance    End of Session PT - End of Session Equipment Utilized During Treatment: Gait belt Activity Tolerance: Patient tolerated treatment well Patient left: in chair;with call bell/phone within reach;with family/visitor  present Nurse Communication: Mobility status CPM Right Knee CPM Right Knee: On Right Knee Flexion (Degrees): 60 Right Knee Extension (Degrees): 0 Additional Comments: Trapeze bar  GP     Meredith Staggers,  Almyra FreeJennifer Kistler 12/18/2013, 1:59 PM [phone]

## 2013-12-18 NOTE — Transfer of Care (Signed)
Immediate Anesthesia Transfer of Care Note  Patient: [name]  Procedure(s) Performed: Procedure(s): TOTAL KNEE ARTHROPLASTY (Right)  Patient Location: PACU  Anesthesia Type:General and Regional  Level of Consciousness: awake, alert , oriented and sedated  Airway & Oxygen Therapy: Patient Spontanous Breathing and Patient connected to nasal cannula oxygen  Post-op Assessment: Report given to PACU RN, Post -op Vital signs reviewed and stable and Patient moving all extremities  Post vital signs: Reviewed and stable  Complications: No apparent anesthesia complications

## 2013-12-18 NOTE — H&P (Signed)
TOTAL KNEE ADMISSION H&P  Patient is being admitted for right total knee arthroplasty.  Subjective:  Chief Complaint:right knee pain.  HPI: [name], 58 y.o. female, has a history of pain and functional disability in the right knee due to arthritis and has failed non-surgical conservative treatments for greater than 12 weeks to includeNSAID's and/or analgesics, corticosteriod injections, viscosupplementation injections, supervised PT with diminished ADL's post treatment, use of assistive devices and activity modification.  Onset of symptoms was gradual, starting 8 years ago with gradually worsening course since that time. The patient noted prior procedures on the knee to include  arthroscopy and menisectomy on the right knee(s).  Patient currently rates pain in the right knee(s) at 8 out of 10 with activity. Patient has night pain, worsening of pain with activity and weight bearing, pain that interferes with activities of daily living, pain with passive range of motion, crepitus and joint swelling.  Patient has evidence of periarticular osteophytes, joint subluxation and joint space narrowing by imaging studies. This patient has had failure of all conservative care. There is no active infection.  There are no active problems to display for this patient.  Past Medical History  Diagnosis Date  . Depression   . Fibromyalgia   . GERD (gastroesophageal reflux disease)   . H/O cardiovascular stress test 12/10/2012    [facility]    Past Surgical History  Procedure Laterality Date  . Abdominal hysterectomy    . Knee scope Right   . Cholecystectomy      Prescriptions prior to admission  Medication Sig Dispense Refill  . ALPRAZolam (XANAX) 0.25 MG tablet Take 0.25 mg by mouth daily as needed for anxiety.      . docusate sodium (COLACE) 100 MG capsule Take 100 mg by mouth daily as needed for mild constipation.      . magnesium oxide (MAG-OX) 400 MG tablet Take 400 mg by mouth  daily.      . Multiple Vitamin (MULTIVITAMIN WITH MINERALS) TABS tablet Take 1 tablet by mouth daily.      Earney [employer] Bicarbonate (ZEGERID) 20-1100 MG CAPS capsule Take 1 capsule by mouth daily before breakfast.      . oxyCODONE-acetaminophen (PERCOCET/ROXICET) 5-325 MG per tablet Take 0.25 tablets by mouth every 8 (eight) hours as needed for severe pain.      . Vitamin D, Ergocalciferol, (DRISDOL) 50000 UNITS CAPS capsule Take 50,000 Units by mouth every 7 (seven) days. On monday       Allergies  Allergen Reactions  . Latex Itching  . Morphine And Related Other (See Comments)    Delusion    History  Substance Use Topics  . Smoking status: Former Smoker -- 0 years    Types: Cigarettes  . Smokeless tobacco: Not on file  . Alcohol Use: No    History reviewed. No pertinent family history.   ROS ROS: I have reviewed the patient's review of systems thoroughly and there are no positive responses as relates to the HPI. Objective:  Physical Exam  Vital signs in last 24 hours: Temp:  [98.4 F (36.9 C)] 98.4 F (36.9 C) (01/30 0550) Pulse Rate:  [79] 79 (01/30 0550) Resp:  [18] 18 (01/30 0550) BP: (146)/(84) 146/84 mmHg (01/30 0550) SpO2:  [100 %] 100 % (01/30 0550) Well-developed well-nourished patient in no acute distress. Alert and oriented x3 HEENT:within normal limits Cardiac: Regular rate and rhythm Pulmonary: Lungs clear to auscultation Abdomen: Soft and nontender.  Normal active bowel sounds  Musculoskeletal: right knee: Medial joint line tenderness.  Pain through range of motion.  No instability.  Grinding and crepitus to range of motion.  Range of motion 0-100. Labs: Recent Results (from the past 2160 hour(s))  APTT     Status: None   Collection Time    12/11/13  2:59 PM      Result Value Range   aPTT 29  24 - 37 seconds  CBC WITH DIFFERENTIAL     Status: None   Collection Time    12/11/13  2:59 PM      Result Value Range   WBC 9.6  4.0 - 10.5 K/uL    RBC 4.38  3.87 - 5.11 MIL/uL   Hemoglobin 12.8  12.0 - 15.0 g/dL   HCT 37.9  36.0 - 46.0 %   MCV 86.5  78.0 - 100.0 fL   MCH 29.2  26.0 - 34.0 pg   MCHC 33.8  30.0 - 36.0 g/dL   RDW 12.6  11.5 - 15.5 %   Platelets 332  150 - 400 K/uL   Neutrophils Relative % 54  43 - 77 %   Neutro Abs 5.1  1.7 - 7.7 K/uL   Lymphocytes Relative 37  12 - 46 %   Lymphs Abs 3.5  0.7 - 4.0 K/uL   Monocytes Relative 9  3 - 12 %   Monocytes Absolute 0.8  0.1 - 1.0 K/uL   Eosinophils Relative 1  0 - 5 %   Eosinophils Absolute 0.1  0.0 - 0.7 K/uL   Basophils Relative 0  0 - 1 %   Basophils Absolute 0.0  0.0 - 0.1 K/uL  COMPREHENSIVE METABOLIC PANEL     Status: None   Collection Time    12/11/13  2:59 PM      Result Value Range   Sodium 140  137 - 147 mEq/L   Potassium 3.7  3.7 - 5.3 mEq/L   Chloride 101  96 - 112 mEq/L   CO2 25  19 - 32 mEq/L   Glucose, Bld 83  70 - 99 mg/dL   BUN 11  6 - 23 mg/dL   Creatinine, Ser 0.64  0.50 - 1.10 mg/dL   Calcium 9.2  8.4 - 10.5 mg/dL   Total Protein 7.4  6.0 - 8.3 g/dL   Albumin 4.0  3.5 - 5.2 g/dL   AST 20  0 - 37 U/L   ALT 30  0 - 35 U/L   Alkaline Phosphatase 83  39 - 117 U/L   Total Bilirubin 0.4  0.3 - 1.2 mg/dL   GFR calc non Af Amer >90  >90 mL/min   GFR calc Af Amer >90  >90 mL/min   Comment: (NOTE)     The eGFR has been calculated using the CKD EPI equation.     This calculation has not been validated in all clinical situations.     eGFR's persistently <90 mL/min signify possible Chronic Kidney     Disease.  PROTIME-INR     Status: None   Collection Time    12/11/13  2:59 PM      Result Value Range   Prothrombin Time 13.0  11.6 - 15.2 seconds   INR 1.00  0.00 - 1.49  TYPE AND SCREEN     Status: None   Collection Time    12/11/13  3:00 PM      Result Value Range   ABO/RH(D) A POS     Antibody Screen  NEG     Sample Expiration 12/25/2013    ABO/RH     Status: None   Collection Time    12/11/13  3:00 PM      Result Value Range   ABO/RH(D) A  POS    URINALYSIS, ROUTINE W REFLEX MICROSCOPIC     Status: Abnormal   Collection Time    12/11/13  3:13 PM      Result Value Range   Color, Urine YELLOW  YELLOW   APPearance CLEAR  CLEAR   Specific Gravity, Urine 1.033 (*) 1.005 - 1.030   pH 5.0  5.0 - 8.0   Glucose, UA NEGATIVE  NEGATIVE mg/dL   Hgb urine dipstick NEGATIVE  NEGATIVE   Bilirubin Urine NEGATIVE  NEGATIVE   Ketones, ur NEGATIVE  NEGATIVE mg/dL   Protein, ur NEGATIVE  NEGATIVE mg/dL   Urobilinogen, UA 0.2  0.0 - 1.0 mg/dL   Nitrite NEGATIVE  NEGATIVE   Leukocytes, UA NEGATIVE  NEGATIVE   Comment: MICROSCOPIC NOT DONE ON URINES WITH NEGATIVE PROTEIN, BLOOD, LEUKOCYTES, NITRITE, OR GLUCOSE <1000 mg/dL.  SURGICAL PCR SCREEN     Status: Abnormal   Collection Time    12/11/13  3:14 PM      Result Value Range   MRSA, PCR POSITIVE (*) NEGATIVE   Staphylococcus aureus POSITIVE (*) NEGATIVE   Comment:            The Xpert SA Assay (FDA     approved for NASAL specimens     in patients over 51 years of age),     is one component of     a comprehensive surveillance     program.  Test performance has     been validated by [employer] for patients greater     than or equal to 90 year old.     It is not intended     to diagnose infection nor to     guide or monitor treatment.    There is no weight on file to calculate BMI.   Imaging Review Plain radiographs demonstrate severe degenerative joint disease of the right knee(s). The overall alignment ismild varus. The bone quality appears to be good for age and reported activity level.  Assessment/Plan:  End stage arthritis, right knee   The patient history, physical examination, clinical judgment of the provider and imaging studies are consistent with end stage degenerative joint disease of the right knee(s) and total knee arthroplasty is deemed medically necessary. The treatment options including medical management, injection therapy arthroscopy and arthroplasty were  discussed at length. The risks and benefits of total knee arthroplasty were presented and reviewed. The risks due to aseptic loosening, infection, stiffness, patella tracking problems, thromboembolic complications and other imponderables were discussed. The patient acknowledged the explanation, agreed to proceed with the plan and consent was signed. Patient is being admitted for inpatient treatment for surgery, pain control, PT, OT, prophylactic antibiotics, VTE prophylaxis, progressive ambulation and ADL's and discharge planning. The patient is planning to be discharged home with home health services

## 2013-12-18 NOTE — Progress Notes (Signed)
Utilization review completed.  

## 2013-12-18 NOTE — Anesthesia Procedure Notes (Signed)
Anesthesia Regional Block:  Femoral nerve block  Pre-Anesthetic Checklist: ,, timeout performed, Correct Patient, Correct Site, Correct Laterality, Correct Procedure, Correct Position, site marked, Risks and benefits discussed,  Surgical consent,  Pre-op evaluation,  At surgeon's request and post-op pain management  Laterality: Right  Prep: chloraprep and alcohol swabs       Needles:  Injection technique: Single-shot  Needle Type: Stimulator Needle - 80        Needle insertion depth: 7 cm   Additional Needles:  Procedures: nerve stimulator Femoral nerve block  Nerve Stimulator or Paresthesia:  Response: 0.5 mA, 0.1 ms, 7 cm  Additional Responses:   Narrative:  Start time: 12/18/2013 7:00 AM End time: 12/18/2013 7:09 AM Injection made incrementally with aspirations every 5 mL.  Performed by: Personally  Anesthesiologist: Maren BeachGregory E Yoni Lobos MD  Additional Notes: Pt accepts procedure w/ risks. 15cc 0.5% Marcaine w/ epi w/o discomfort w/ mild difficulty. GES

## 2013-12-18 NOTE — Anesthesia Postprocedure Evaluation (Signed)
  Anesthesia Post-op Note  Patient: [name]  Procedure(s) Performed: Procedure(s): TOTAL KNEE ARTHROPLASTY (Right)  Patient Location: PACU  Anesthesia Type:General  Level of Consciousness: awake, alert , oriented and patient cooperative  Airway and Oxygen Therapy: Patient Spontanous Breathing  Post-op Pain: mild  Post-op Assessment: Post-op Vital signs reviewed, Patient's Cardiovascular Status Stable, Respiratory Function Stable, Patent Airway, No signs of Nausea or vomiting and Pain level controlled  Post-op Vital Signs: stable  Complications: No apparent anesthesia complications

## 2013-12-18 NOTE — Discharge Instructions (Signed)
Total Knee Replacement °Care After °Refer to this sheet in the next few weeks. These instructions provide you with information on caring for yourself after your procedure. Your caregiver also may give you specific instructions. Your treatment has been planned according to the most current medical practices, but problems sometimes occur. Call your caregiver if you have any problems or questions after your procedure. °HOME CARE INSTRUCTIONS  °· See a physical therapist as directed by your caregiver. °· Take over-the-counter or prescription medicines for pain, discomfort, or fever only as directed by your caregiver. °· Avoid lifting or driving until you are instructed otherwise. °· If you have been sent home with a continuous passive motion machine, use it as directed by your caregiver. °SEEK MEDICAL CARE IF: °· You have difficulty breathing. °· Your wound is red, swollen, or has become increasingly painful. °· You have pus draining from your wound. °· You have a bad smell coming from your wound. °· You have persistent bleeding from your wound. °· Your wound breaks open after sutures (stitches) or staples have been removed. °SEEK IMMEDIATE MEDICAL CARE IF:  °· You have a fever. °· You have a rash. °· You have pain or swelling in your calf or thigh. °· You have shortness of breath or chest pain. °· Your range of motion in your knee is decreasing rather than increasing. °MAKE SURE YOU:  °· Understand these instructions. °· Will watch your condition. °· Will get help right away if you are not doing well or get worse. °Document Released: 05/25/2005 Document Revised: 05/06/2012 Document Reviewed: 12/25/2011 °[employer]® Patient Information ©2014 [employer]. ° °

## 2013-12-18 NOTE — Anesthesia Preprocedure Evaluation (Signed)
Anesthesia Evaluation  Patient identified by MRN, date of birth, ID band Patient awake    Reviewed: Allergy & Precautions, H&P , NPO status , Patient's Chart, lab work & pertinent test results  Airway       Dental   Pulmonary former smoker,          Cardiovascular     Neuro/Psych  Neuromuscular disease    GI/Hepatic GERD-  ,  Endo/Other    Renal/GU      Musculoskeletal  (+) Fibromyalgia -  Abdominal   Peds  Hematology   Anesthesia Other Findings   Reproductive/Obstetrics                           Anesthesia Physical Anesthesia Plan  ASA: I  Anesthesia Plan: General   Post-op Pain Management:    Induction: Intravenous  Airway Management Planned: LMA and Oral ETT  Additional Equipment:   Intra-op Plan:   Post-operative Plan: Extubation in OR  Informed Consent: I have reviewed the patients History and Physical, chart, labs and discussed the procedure including the risks, benefits and alternatives for the proposed anesthesia with the patient or authorized representative who has indicated his/her understanding and acceptance.     Plan Discussed with:   Anesthesia Plan Comments:         Anesthesia Quick Evaluation

## 2013-12-18 NOTE — Brief Op Note (Signed)
12/18/2013  9:13 AM  PATIENT:  [name]  58 y.o. female  PRE-OPERATIVE DIAGNOSIS:  DEGENERATIVE JOINT DISEASE R KNEE  POST-OPERATIVE DIAGNOSIS:  degenerative joint disease right knee  PROCEDURE:  Procedure(s): TOTAL KNEE ARTHROPLASTY (Right)  SURGEON:  Surgeon(s) and Role:    * Harvie JuniorJohn L Virginio Isidore, MD - Primary  PHYSICIAN ASSISTANT:   ASSISTANTS: bethune   ANESTHESIA:   general  EBL:  Total I/O In: -  Out: 150 [Urine:150]  BLOOD ADMINISTERED:none  DRAINS: (1) Hemovact drain(s) in the r knee with  Suction Open   LOCAL MEDICATIONS USED:    and OTHER Experel  SPECIMEN:  No Specimen  DISPOSITION OF SPECIMEN:  N/A  COUNTS:  YES  TOURNIQUET:   Total Tourniquet Time Documented: Thigh (Right) - 55 minutes Total: Thigh (Right) - 55 minutes   DICTATION: .Other Dictation: Dictation Number [id number]  PLAN OF CARE: Admit to inpatient   PATIENT DISPOSITION:  PACU - hemodynamically stable.   Delay start of Pharmacological VTE agent (>24hrs) due to surgical blood loss or risk of bleeding: no

## 2013-12-18 NOTE — Progress Notes (Signed)
Orthopedic Tech Progress Note Patient Details:  Kristy ResidesDonna M Brewer [birth year] [id number]  CPM Right Knee CPM Right Knee: On Right Knee Flexion (Degrees): 60 Right Knee Extension (Degrees): 0 Additional Comments: Trapeze bar   Cammer, Mickie BailJennifer Brewer 12/18/2013, 12:05 PM

## 2013-12-19 LAB — CBC
HCT: 32.8 % — ABNORMAL LOW (ref 36.0–46.0)
HEMOGLOBIN: 11.3 g/dL — AB (ref 12.0–15.0)
MCH: 30 pg (ref 26.0–34.0)
MCHC: 34.5 g/dL (ref 30.0–36.0)
MCV: 87 fL (ref 78.0–100.0)
Platelets: 254 10*3/uL (ref 150–400)
RBC: 3.77 MIL/uL — AB (ref 3.87–5.11)
RDW: 12.7 % (ref 11.5–15.5)
WBC: 17.4 10*3/uL — ABNORMAL HIGH (ref 4.0–10.5)

## 2013-12-19 LAB — BASIC METABOLIC PANEL
BUN: 9 mg/dL (ref 6–23)
CHLORIDE: 105 meq/L (ref 96–112)
CO2: 23 meq/L (ref 19–32)
CREATININE: 0.6 mg/dL (ref 0.50–1.10)
Calcium: 8.6 mg/dL (ref 8.4–10.5)
GFR calc Af Amer: >90 mL/min (ref >90)
GFR calc non Af Amer: >90 mL/min (ref >90)
Glucose, Bld: 136 mg/dL — ABNORMAL HIGH (ref 70–99)
Potassium: 4 mEq/L (ref 3.7–5.3)
Sodium: 140 mEq/L (ref 137–147)

## 2013-12-19 NOTE — Progress Notes (Signed)
PO Day 1 S/P R TKR per Dr Luiz BlareGraves. Doing superb, reports little to no pain, has only had Vicodin intermittently. Tolerating CPM well, reports already having home unit. Pt eager to D/C home. Eating well, denies N/V/SOB/Constipation  BP 111/66  Pulse 99  Temp(Src) 98.1 F (36.7 C) (Oral)  Resp 18  SpO2 97% R knee drain: 1400 total output, removed w/o difficulty today.  WBC 17.4, Hg 11.3, Hct 32.8  Pt sitting up in hospital bed enjoying breakfast, CPM in place and functioning appropriately. SCD LLE in place. Dressing C/D/I. Drain in place and removed w/o difficulty. Full R ankle ROM, no substantial distal edema. 2+ DPP=BIL, NVI.  PO Day 1 S/P R TKR per Dr Luiz BlareGraves, Doing superb  -Px very well controlled, continue current regimen PRN   -Scripts written and signed in chart  -Continue CPM  -Up with PT today, pt eager to D/C home later today if passes PT   -WBAT  -Cont ASA and SCD   -D/C home today after PT    -Pt reports already having HH and help at home    -Change dressing per nursing prior to D/C

## 2013-12-19 NOTE — Op Note (Signed)
NAMHilaria Brewer:  Brewer, Kristy              ACCOUNT NO.:  [id number]  MEDICAL RECORD NO.:  [id number]  LOCATION:  [id number]                        FACILITY:  [facility]  PHYSICIAN:  Harvie JuniorJohn L. Kipper Buch, M.D.   DATE OF BIRTH:  October 22, 1956  DATE OF PROCEDURE:  12/18/2013 DATE OF DISCHARGE:                              OPERATIVE REPORT   PREOPERATIVE DIAGNOSIS:  End-stage degenerative joint disease, right knee.  POSTOPERATIVE DIAGNOSIS:  End-stage degenerative joint disease, right knee.  PROCEDURE:  Right total knee replacement with Sigma system, size 3 femur, size 3 tibia, 12.5 mm bridging bearing, and a 35-mm all- polyethylene patella.  SURGEON:  Harvie JuniorJohn L. Pariss Hommes, M.D.  ASSISTANT:  Marshia LyJames Bethune, P.A.  ANESTHESIA:  General.  BRIEF HISTORY:  Ms. [name] is a 58 year old female with a history of having significant complaints of right knee pain.  She had been treated conservatively for a period of time with activity modification, arthroscopy, injection therapy.  After failure of all conservative care, complains of night pain and light activity pain, she was taken to the operating room for right total knee replacement.  DESCRIPTION OF PROCEDURE:  The patient was taken to the operating room. After adequate anesthesia was obtained with general anesthetic, the patient was placed supine on the operating table.  The right leg was prepped and draped in usual sterile fashion.  Following this, the leg was exsanguinated.  Blood pressure tourniquet inflated to 350 mmHg. Following this, a midline incision was made in the subcutaneous tissues down to the level of the sensor mechanism and medial parapatellar arthrotomy was undertaken.  Following this, mediolateral meniscus was removed, retropatellar fat pad, synovium in the anterior aspect of the femur and anterior-posterior cruciates.  Following this, attention was turned to the tibia which cut perpendicular to its long axis with an extramedullary guide, and  following this, attention turned to the femur where an intramedullary pilot hole was drilled and a 5-degree valgus cut is made with 10 degrees of distal bone being resected.  Once this was done, the spacer blocks were put in place, comes in easy full extension. Attention was turned to the femur, which sized to a 3.  Its anterior- posterior cuts were made, chamfers and box.  The meniscus from the back was then removed both sides.  Attention then turned to the tibia which was sized to a 3, it was drilled and keeled.  Trials were put in place and attention turned to the patella, cut down to the level of 14 mm and a 35-mm all-poly patella was placed, and lugs were drilled for this. The knee was put through a range of motion, excellent stability, range of motion achieved.  At this point, the knee was copiously and thoroughly lavaged, suctioned dry.  At this point, the final components were cemented in place size 3 femur, size 3 tibia, 12.5 mm bridging bearing trial was placed to allow the cement to harden and the patella was held with a clamp.  Once all the final components cemented in place and, all excess bone cement has been removed, and the cement was completely hardened, tourniquet was let down.  All bleeding was controlled with electrocautery.  The 15 polys and trials  too big, get back to the 12.5 which has great feel in flexion and extension.  The range of motion is excellent.  Medium Hemovac drain was placed.  60 mL of Exparel was instilled into the posterior capsule, medial and lateral capsular structures within the fat and tissue in the knee.  Once this was done, the medium Hemovac drain was placed.  The medial parapatellar arthrotomy was closed with 1 Vicryl running, skin with 0 and 2-0 Vicryl and 3-0 Monocryl subcuticular.  Benzoin and Steri-Strips were applied. Sterile compressive dressing was applied.  The patient was taken to the recovery room, she was noted to be in satisfactory  condition.  Estimated blood loss for the procedure is none.     Harvie Junior, M.D.     Ranae Plumber  D:  12/18/2013  T:  12/19/2013  Job:  [id number]

## 2013-12-19 NOTE — Progress Notes (Signed)
Physical Therapy Treatment Patient Details Name: [name] MRN: [id number] DOB: 10/18/1956 Today's Date: 12/19/2013 Time: [id number] PT Time Calculation (min): 32 min  PT Assessment / Plan / Recommendation  History of Present Illness R TKA   PT Comments   Pt making great progress with mobility today. Stair education completed today. Safe for home discharge with family assistance at current mobility level.   Follow Up Recommendations  Home health PT     Equipment Recommendations  None recommended by PT    Recommendations for Other Services OT consult  Frequency 7X/week   Progress towards PT Goals Progress towards PT goals: Progressing toward goals  Plan Current plan remains appropriate    Precautions / Restrictions Precautions Precautions: Knee Precaution Comments: educated on no pillow under knee and general knee care after TKA. Required Braces or Orthoses: Knee Immobilizer - Right Knee Immobilizer - Right: On when out of bed or walking Restrictions RLE Weight Bearing: Weight bearing as tolerated       Mobility  Bed Mobility Overal bed mobility: Modified Independent Bed Mobility: Supine to Sit Supine to sit: Modified independent (Device/Increase time) General bed mobility comments: Bed flat and no rail used. pt educated to use belt to advance right leg to and off edge of bed. Transfers Overall transfer level: Needs assistance Equipment used: Rolling walker (2 wheeled) Transfers: Sit to/from Stand Sit to Stand: Supervision General transfer comment: demo'd safe technique with tranfers, supervision for safety only Ambulation/Gait Ambulation/Gait assistance: Min guard;Supervision Ambulation Distance (Feet): 100 Feet Assistive device: Rolling walker (2 wheeled) Gait Pattern/deviations: Step-through pattern;Antalgic Gait velocity: decreased Gait velocity interpretation: Below normal speed for age/gender General Gait Details: occasional cues on walker position  with gait and for posture. Stairs: Yes Stairs assistance: Min guard Stair Management: One rail Right;Step to pattern;Sideways Number of Stairs: 5 General stair comments: cues on sequence and technique with use of one rail sideways.    Exercises Total Joint Exercises Ankle Circles/Pumps: AROM;Both;10 reps;Supine Quad Sets: AROM;Strengthening;Right;10 reps;Supine Short Arc Quad: AAROM;Strengthening;Right;10 reps;Supine Heel Slides: AAROM;Strengthening;Right;10 reps;Supine Hip ABduction/ADduction: AAROM;Strengthening;Right;10 reps;Supine Straight Leg Raises: AAROM;Strengthening;Right;10 reps;Supine Goniometric ROM: AAROM supine in bed: 0-90 degrees flexion     PT Goals (current goals can now be found in the care plan section) Acute Rehab PT Goals Patient Stated Goal: to travel PT Goal Formulation: With patient Time For Goal Achievement: 12/22/13 Potential to Achieve Goals: Good  Visit Information  Last PT Received On: 12/19/13 Assistance Needed: +1 History of Present Illness: R TKA    Subjective Data  Patient Stated Goal: to travel   Cognition  Cognition Arousal/Alertness: Awake/alert Behavior During Therapy: WFL for tasks assessed/performed Overall Cognitive Status: Within Functional Limits for tasks assessed    End of Session PT - End of Session Equipment Utilized During Treatment: Gait belt;Right knee immobilizer Activity Tolerance: Patient tolerated treatment well Patient left: in chair;with call bell/phone within reach Nurse Communication: Mobility status CPM Right Knee CPM Right Knee: Off Right Knee Flexion (Degrees): 60 Right Knee Extension (Degrees): 0   GP     Sallyanne KusterBury, Srihaan Mastrangelo 12/19/2013, 8:16 AM  Sallyanne KusterKathy Micholas Drumwright, PTA Office- [phone]

## 2013-12-19 NOTE — Progress Notes (Signed)
Pt discharged to home. D/c instructions given, no questions verbalized. Vitals stable. 

## 2013-12-21 ENCOUNTER — Encounter (HOSPITAL_COMMUNITY): Payer: Self-pay | Admitting: Orthopedic Surgery

## 2013-12-21 NOTE — Discharge Summary (Signed)
Patient ID: [name] MRN: [id number] DOB/AGE: 1956-07-29 58 y.o.  Admit date: 12/18/2013 Discharge date: 12/19/2013 Admission Diagnoses:  Principal Problem:   Osteoarthritis of right knee Active Problems:   Obesity, unspecified   Discharge Diagnoses:  Same  Past Medical History  Diagnosis Date  . Depression   . Fibromyalgia   . GERD (gastroesophageal reflux disease)   . H/O cardiovascular stress test 12/10/2012    [name]    Surgeries: Procedure(s):Right  TOTAL KNEE ARTHROPLASTY on 12/18/2013   Discharged Condition: Improved  Hospital Course: [name] is an 58 y.o. female who was admitted 12/18/2013 for operative treatment ofOsteoarthritis of right knee. Patient has severe unremitting pain that affects sleep, daily activities, and work/hobbies. After pre-op clearance the patient was taken to the operating room on 12/18/2013 and underwent  Procedure(s):Right TOTAL KNEE ARTHROPLASTY.    Patient was given perioperative antibiotics: Anti-infectives   Start     Dose/Rate Route Frequency Ordered Stop   12/18/13 1400  ceFAZolin (ANCEF) IVPB 2 g/50 mL premix     2 g 100 mL/hr over 30 Minutes Intravenous Every 6 hours 12/18/13 1205 12/18/13 2130   12/18/13 0600  ceFAZolin (ANCEF) IVPB 2 g/50 mL premix     2 g 100 mL/hr over 30 Minutes Intravenous On call to O.R. 12/17/13 1425 12/18/13 0742       Patient was given sequential compression devices, early ambulation, and chemoprophylaxis to prevent DVT.  Patient benefited maximally from hospital stay and there were no complications.    Recent vital signs: see chart   Recent laboratory studies:  Recent Labs  12/19/13 0535  WBC 17.4*  HGB 11.3*  HCT 32.8*  PLT 254  NA 140  K 4.0  CL 105  CO2 23  BUN 9  CREATININE 0.60  GLUCOSE 136*  CALCIUM 8.6     Discharge Medications:     Medication List         ALPRAZolam 0.25 MG tablet  Commonly known as:  XANAX  Take 0.25 mg by mouth daily as  needed for anxiety.     aspirin EC 325 MG tablet  Take 1 tablet (325 mg total) by mouth 2 (two) times daily after a meal.     docusate sodium 100 MG capsule  Commonly known as:  COLACE  Take 100 mg by mouth daily as needed for mild constipation.     magnesium oxide 400 MG tablet  Commonly known as:  MAG-OX  Take 400 mg by mouth daily.     methocarbamol 750 MG tablet  Commonly known as:  ROBAXIN-750  Take 1 tablet (750 mg total) by mouth every 8 (eight) hours as needed for muscle spasms.     multivitamin with minerals Tabs tablet  Take 1 tablet by mouth daily.     oxyCODONE-acetaminophen 5-325 MG per tablet  Commonly known as:  PERCOCET/ROXICET  Take 0.25 tablets by mouth every 8 (eight) hours as needed for severe pain.     oxyCODONE-acetaminophen 5-325 MG per tablet  Commonly known as:  PERCOCET/ROXICET  Take 1-2 tablets by mouth every 6 (six) hours as needed for severe pain.     Vitamin D (Ergocalciferol) 50000 UNITS Caps capsule  Commonly known as:  DRISDOL  Take 50,000 Units by mouth every 7 (seven) days. On monday     ZEGERID 20-1100 MG Caps capsule  Generic drug:  Omeprazole-Sodium Bicarbonate  Take 1 capsule by mouth daily before breakfast.  Diagnostic Studies: No results found.  Disposition: 01-Home or Self Care      Discharge Orders   Future Orders Complete By Expires   Weight bearing as tolerated  As directed    Questions:     Laterality:     Extremity:      She will be set up for home health physical therapy.  WBAT on the right.  She will also be set up with a home CPM machine.  Follow-up Information   Follow up with GRAVES,JOHN L, MD. Schedule an appointment as soon as possible for a visit in 2 weeks.   Specialty:  Orthopedic Surgery   Contact information:   [address] [place] [address] [phone]        Signed: Matthew FolksBETHUNE,Karilyn Wind G 12/21/2013, 1:34 PM

## 2015-05-11 ENCOUNTER — Encounter (HOSPITAL_COMMUNITY): Payer: Self-pay

## 2015-05-11 ENCOUNTER — Emergency Department (HOSPITAL_COMMUNITY)
Admission: EM | Admit: 2015-05-11 | Discharge: 2015-05-11 | Disposition: A | Discharge status: Home / Self Care | Payer: Self-pay | Attending: Emergency Medicine | Admitting: Emergency Medicine

## 2015-05-11 ENCOUNTER — Emergency Department (HOSPITAL_COMMUNITY): Payer: Self-pay

## 2015-05-11 DIAGNOSIS — W19XXXA Unspecified fall, initial encounter: Secondary | ICD-10-CM

## 2015-05-11 DIAGNOSIS — Z7982 Long term (current) use of aspirin: Secondary | ICD-10-CM | POA: Insufficient documentation

## 2015-05-11 DIAGNOSIS — Y998 Other external cause status: Secondary | ICD-10-CM | POA: Insufficient documentation

## 2015-05-11 DIAGNOSIS — Z9289 Personal history of other medical treatment: Secondary | ICD-10-CM | POA: Insufficient documentation

## 2015-05-11 DIAGNOSIS — F329 Major depressive disorder, single episode, unspecified: Secondary | ICD-10-CM | POA: Insufficient documentation

## 2015-05-11 DIAGNOSIS — Y9289 Other specified places as the place of occurrence of the external cause: Secondary | ICD-10-CM | POA: Insufficient documentation

## 2015-05-11 DIAGNOSIS — S4991XA Unspecified injury of right shoulder and upper arm, initial encounter: Secondary | ICD-10-CM | POA: Insufficient documentation

## 2015-05-11 DIAGNOSIS — Y9389 Activity, other specified: Secondary | ICD-10-CM | POA: Insufficient documentation

## 2015-05-11 DIAGNOSIS — M25511 Pain in right shoulder: Secondary | ICD-10-CM

## 2015-05-11 DIAGNOSIS — W108XXA Fall (on) (from) other stairs and steps, initial encounter: Secondary | ICD-10-CM | POA: Insufficient documentation

## 2015-05-11 DIAGNOSIS — K219 Gastro-esophageal reflux disease without esophagitis: Secondary | ICD-10-CM | POA: Insufficient documentation

## 2015-05-11 DIAGNOSIS — Z8739 Personal history of other diseases of the musculoskeletal system and connective tissue: Secondary | ICD-10-CM | POA: Insufficient documentation

## 2015-05-11 DIAGNOSIS — Z87891 Personal history of nicotine dependence: Secondary | ICD-10-CM | POA: Insufficient documentation

## 2015-05-11 DIAGNOSIS — Z79899 Other long term (current) drug therapy: Secondary | ICD-10-CM | POA: Insufficient documentation

## 2015-05-11 MED ORDER — OXYCODONE-ACETAMINOPHEN 5-325 MG PO TABS: 1.0000 | ORAL_TABLET | Freq: Four times a day (QID) | ORAL | Status: AC | PRN

## 2015-05-11 MED ORDER — IBUPROFEN 800 MG PO TABS: 800.0000 mg | ORAL_TABLET | Freq: Once | ORAL | Status: DC

## 2015-05-11 NOTE — ED Notes (Signed)
Patiaent states she fell down 6 steps 2 days ago, landing on her right shoulder. Patient denies any LOC or hitting her head. Patient does c/o right shoulder pain and tingling in the right arm.

## 2015-05-11 NOTE — ED Provider Notes (Signed)
CSN: [id number]     Arrival date & time 05/11/15  9937 History   First MD Initiated Contact with Patient 05/11/15 [phone]     Chief Complaint  Patient presents with  . Shoulder Injury     (Consider location/radiation/quality/duration/timing/severity/associated sxs/prior Treatment) HPI  Pt presenting with c/o pain in right shoulder.  She states she fell down several steps 2 days ago and landed on her lateral right shoulder.  Since then the pain has not improved. Pain is constant and aching, but worse with movement and palpation.  Did not strike her head, no LOC.  No neck or back pain.  No weakness of arm, no swelling of arm or hand.  Some pain in her right trapezius distribution.  There are no other associated systemic symptoms, there are no other alleviating or modifying factors.   Past Medical History  Diagnosis Date  . Depression   . Fibromyalgia   . GERD (gastroesophageal reflux disease)   . H/O cardiovascular stress test 12/10/2012    [facility]   Past Surgical History  Procedure Laterality Date  . Abdominal hysterectomy    . Knee scope Right   . Cholecystectomy    . 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 Cancer Mother   . Hypertension Father   . Diabetes Father    History  Substance Use Topics  . Smoking status: Former Smoker -- 0 years    Types: Cigarettes  . Smokeless tobacco: Never Used  . Alcohol Use: No   OB History    No data available     Review of Systems  ROS reviewed and all otherwise negative except for mentioned in HPI    Allergies  Latex and Morphine and related  Home Medications   Prior to Admission medications   Medication Sig Start Date End Date Taking? Authorizing Provider  ALPRAZolam Prudy Feeler) 0.25 MG tablet Take 0.25 mg by mouth daily as needed for  anxiety.   Yes Historical Provider, MD  cholecalciferol (VITAMIN D) 1000 UNITS tablet Take 1,000 Units by mouth daily.   Yes Historical Provider, MD  ibuprofen (ADVIL,MOTRIN) 200 MG tablet Take 200-800 mg by mouth every 6 (six) hours as needed for headache, mild pain or moderate pain.   Yes Historical Provider, MD  magnesium oxide (MAG-OX) 400 MG tablet Take 400 mg by mouth daily.   Yes Historical Provider, MD  Multiple Vitamin (MULTIVITAMIN WITH MINERALS) TABS tablet Take 1 tablet by mouth daily.   Yes Historical Provider, MD  naproxen sodium (ANAPROX) 220 MG tablet Take 400 mg by mouth every 12 (twelve) hours as needed (pain).   Yes Historical Provider, MD  Omega-3 Fatty Acids (FISH OIL) 1000 MG CAPS Take 1,000 mg by mouth daily.   Yes Historical Provider, MD  Omeprazole-Sodium Bicarbonate (ZEGERID) 20-1100 MG CAPS capsule Take 1 capsule by mouth daily as needed (heartburn).    Yes Historical Provider, MD  aspirin EC 325 MG tablet Take 1 tablet (325 mg total) by mouth 2 (two) times daily after a meal. Patient not taking: Reported on 05/11/2015 12/18/13   Marshia Ly, PA-C  methocarbamol (ROBAXIN-750) 750 MG tablet Take 1 tablet (750 mg total) by mouth every 8 (eight) hours as needed for muscle spasms. Patient not taking: Reported on 05/11/2015 12/18/13   Marshia Ly, PA-C  oxyCODONE-acetaminophen (PERCOCET/ROXICET) 5-325 MG per tablet Take 1-2 tablets by mouth every 6 (six) hours as needed for severe pain. 05/11/15   Jerelyn Scott, MD  Vitamin D, Ergocalciferol, (DRISDOL) 50000 UNITS CAPS capsule Take 50,000 Units by mouth every 7 (seven) days. On monday    Historical Provider, MD   BP 141/82 mmHg  Pulse 83  Temp(Src) 98.2 F (36.8 C) (Oral)  Resp 16  SpO2 100%  Vitals reviewed Physical Exam  Physical Examination: General appearance - alert, well appearing, and in no distress Mental status - alert, oriented to person, place, and time Head- NCAT Eyes - no conjunctival injection no scleral  icterus Mouth - mucous membranes moist, pharynx normal without lesions Neck - no midline tenderness to palpation, FROM wtihout pain Chest - clear to auscultation, no wheezes, rales or rhonchi, symmetric air entry Heart - normal rate, regular rhythm, normal S1, S2, no murmurs, rubs, clicks or gallops Abdomen - soft, nontender, nondistended, no masses or organomegaly Neurological - alert, oriented x 3, strength 5/5 in extremities x 4, sensation intact, cranial nerves intact Musculoskeletal -ttp over right lateral shoulder, also ttp over right trapezius distribution, pain with ROM of right shoulder, both active and passive, otherwise  no joint tenderness, deformity or swelling, RUE is distally NVI Extremities - peripheral pulses normal, no pedal edema, no clubbing or cyanosis Skin - normal coloration and turgor, no rashes  ED Course  Procedures (including critical care time) Labs Review Labs Reviewed - No data to display  Imaging Review No results found.   EKG Interpretation None      MDM   Final diagnoses:  Shoulder pain, acute, right  Fall, initial encounter    Pt presenting with c/o right shoulder pain after mechanical fall.  Xray is reassuring.  Pt placed in sling for comfort, but given strict instructions on range of motion exercises.  Pt given information for ortho followup.  Discharged with strict return precautions.  Pt agreeable with plan.    Jerelyn Scott, MD 05/17/15 [phone]

## 2015-05-11 NOTE — Discharge Instructions (Signed)
Return to the ED with any concerns including increased pain, swelling/discoloration/numbness of fingers, swelling of arm or hand, or any other alarming symptoms

## 2015-05-11 NOTE — ED Notes (Signed)
Dr. Linker at bedside  

## 2015-11-18 ENCOUNTER — Encounter (HOSPITAL_COMMUNITY): Payer: Self-pay | Admitting: Emergency Medicine

## 2015-11-18 ENCOUNTER — Emergency Department (HOSPITAL_COMMUNITY)
Admission: EM | Admit: 2015-11-18 | Discharge: 2015-11-18 | Disposition: A | Discharge status: Home / Self Care | Payer: Managed Care, Other (non HMO) | Attending: Emergency Medicine | Admitting: Emergency Medicine

## 2015-11-18 DIAGNOSIS — Z87891 Personal history of nicotine dependence: Secondary | ICD-10-CM | POA: Diagnosis not present

## 2015-11-18 DIAGNOSIS — J069 Acute upper respiratory infection, unspecified: Secondary | ICD-10-CM | POA: Diagnosis not present

## 2015-11-18 DIAGNOSIS — Z79899 Other long term (current) drug therapy: Secondary | ICD-10-CM | POA: Diagnosis not present

## 2015-11-18 DIAGNOSIS — Z91040 Latex allergy status: Secondary | ICD-10-CM | POA: Insufficient documentation

## 2015-11-18 DIAGNOSIS — Z8739 Personal history of other diseases of the musculoskeletal system and connective tissue: Secondary | ICD-10-CM | POA: Insufficient documentation

## 2015-11-18 DIAGNOSIS — K219 Gastro-esophageal reflux disease without esophagitis: Secondary | ICD-10-CM | POA: Insufficient documentation

## 2015-11-18 DIAGNOSIS — R51 Headache: Secondary | ICD-10-CM | POA: Diagnosis present

## 2015-11-18 DIAGNOSIS — F329 Major depressive disorder, single episode, unspecified: Secondary | ICD-10-CM | POA: Insufficient documentation

## 2015-11-18 DIAGNOSIS — H539 Unspecified visual disturbance: Secondary | ICD-10-CM | POA: Insufficient documentation

## 2015-11-18 MED ORDER — PHENYLEPHRINE HCL 10 MG PO TABS: 10.0000 mg | ORAL_TABLET | Freq: Four times a day (QID) | ORAL | Status: AC | PRN

## 2015-11-18 MED ORDER — PREDNISONE 20 MG PO TABS
60.0000 mg | ORAL_TABLET | Freq: Once | ORAL | Status: AC
  Administered 2015-11-18: 60 mg via ORAL
  Filled 2015-11-18: qty 3

## 2015-11-18 MED ORDER — GUAIFENESIN-DM 100-10 MG/5ML PO SYRP: 5.0000 mL | ORAL_SOLUTION | Freq: Three times a day (TID) | ORAL | Status: AC | PRN

## 2015-11-18 NOTE — ED Notes (Signed)
Pt c/o green/red nasal drainage, sinus pain, cough, sore throat x 4 days.

## 2015-11-18 NOTE — Discharge Instructions (Signed)
Upper Respiratory Infection, Adult Most upper respiratory infections (URIs) are a viral infection of the air passages leading to the lungs. A URI affects the nose, throat, and upper air passages. The most common type of URI is nasopharyngitis and is typically referred to as "the common cold." URIs run their course and usually go away on their own. Most of the time, a URI does not require medical attention, but sometimes a bacterial infection in the upper airways can follow a viral infection. This is called a secondary infection. Sinus and middle ear infections are common types of secondary upper respiratory infections. Bacterial pneumonia can also complicate a URI. A URI can worsen asthma and chronic obstructive pulmonary disease (COPD). Sometimes, these complications can require emergency medical care and may be life threatening.  CAUSES Almost all URIs are caused by viruses. A virus is a type of germ and can spread from one person to another.  RISKS FACTORS You may be at risk for a URI if:   You smoke.   You have chronic heart or lung disease.  You have a weakened defense (immune) system.   You are very young or very old.   You have nasal allergies or asthma.  You work in crowded or poorly ventilated areas.  You work in health care facilities or schools. SIGNS AND SYMPTOMS  Symptoms typically develop 2-3 days after you come in contact with a cold virus. Most viral URIs last 7-10 days. However, viral URIs from the influenza virus (flu virus) can last 14-18 days and are typically more severe. Symptoms may include:   Runny or stuffy (congested) nose.   Sneezing.   Cough.   Sore throat.   Headache.   Fatigue.   Fever.   Loss of appetite.   Pain in your forehead, behind your eyes, and over your cheekbones (sinus pain).  Muscle aches.  DIAGNOSIS  Your health care provider may diagnose a URI by:  Physical exam.  Tests to check that your symptoms are not due to  another condition such as:  Strep throat.  Sinusitis.  Pneumonia.  Asthma. TREATMENT  A URI goes away on its own with time. It cannot be cured with medicines, but medicines may be prescribed or recommended to relieve symptoms. Medicines may help:  Reduce your fever.  Reduce your cough.  Relieve nasal congestion. HOME CARE INSTRUCTIONS   Take medicines only as directed by your health care provider.   Gargle warm saltwater or take cough drops to comfort your throat as directed by your health care provider.  Use a warm mist humidifier or inhale steam from a shower to increase air moisture. This may make it easier to breathe.  Drink enough fluid to keep your urine clear or pale yellow.   Eat soups and other clear broths and maintain good nutrition.   Rest as needed.   Return to work when your temperature has returned to normal or as your health care provider advises. You may need to stay home longer to avoid infecting others. You can also use a face mask and careful hand washing to prevent spread of the virus.  Increase the usage of your inhaler if you have asthma.   Do not use any tobacco products, including cigarettes, chewing tobacco, or electronic cigarettes. If you need help quitting, ask your health care provider. PREVENTION  The best way to protect yourself from getting a cold is to practice good hygiene.   Avoid oral or hand contact with people with cold   symptoms.   Wash your hands often if contact occurs.  There is no clear evidence that vitamin C, vitamin E, echinacea, or exercise reduces the chance of developing a cold. However, it is always recommended to get plenty of rest, exercise, and practice good nutrition.  SEEK MEDICAL CARE IF:   You are getting worse rather than better.   Your symptoms are not controlled by medicine.   You have chills.  You have worsening shortness of breath.  You have brown or red mucus.  You have yellow or brown nasal  discharge.  You have pain in your face, especially when you bend forward.  You have a fever.  You have swollen neck glands.  You have pain while swallowing.  You have white areas in the back of your throat. SEEK IMMEDIATE MEDICAL CARE IF:   You have severe or persistent:  Headache.  Ear pain.  Sinus pain.  Chest pain.  You have chronic lung disease and any of the following:  Wheezing.  Prolonged cough.  Coughing up blood.  A change in your usual mucus.  You have a stiff neck.  You have changes in your:  Vision.  Hearing.  Thinking.  Mood. MAKE SURE YOU:   Understand these instructions.  Will watch your condition.  Will get help right away if you are not doing well or get worse.   This information is not intended to replace advice given to you by your health care provider. Make sure you discuss any questions you have with your health care provider.   Document Released: 05/01/2001 Document Revised: 03/22/2015 Document Reviewed: 02/10/2014 Elsevier Interactive Patient Education 2016 [employer]  

## 2015-11-18 NOTE — ED Notes (Signed)
Pt escorted to discharge window. Verbalized understanding discharge instructions. In no acute distress.   

## 2015-11-18 NOTE — ED Provider Notes (Signed)
CSN: [id number]     Arrival date & time 11/18/15  4098 History   First MD Initiated Contact with Patient 11/18/15 [phone]     Chief Complaint  Patient presents with  . Facial Pain      The history is provided by the patient.   patient has had sinus congestion and facial pain for last 4 days. States she's had some green and red sputum production. States she has pain behind her eyes. No fevers. No trauma. States she does have occasional double vision. Has cough from sputum going down the back of her throat. No fevers. No nausea vomiting. No numbness or weakness. Has had sick contacts with people with similar symptoms. States she has a throbbing headache. No relief with over-the-counter cold medicine.  Past Medical History  Diagnosis Date  . Depression   . Fibromyalgia   . GERD (gastroesophageal reflux disease)   . H/O cardiovascular stress test 12/10/2012    [facility]   Past Surgical History  Procedure Laterality Date  . Abdominal hysterectomy    . Knee scope Right   . Cholecystectomy    . 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 Cancer Mother   . Hypertension Father   . Diabetes Father    Social History  Substance Use Topics  . Smoking status: Former Smoker -- 0 years    Types: Cigarettes  . Smokeless tobacco: Never Used  . Alcohol Use: No   OB History    No data available     Review of Systems  Constitutional: Negative for appetite change.  HENT: Positive for congestion, rhinorrhea, sinus pressure and sore throat. Negative for ear discharge, facial swelling, nosebleeds, trouble swallowing and voice change.   Eyes: Positive for visual disturbance.  Respiratory: Positive for cough.   Cardiovascular: Negative for chest pain.  Musculoskeletal: Negative for back pain.  Skin: Negative  for color change.  Neurological: Positive for headaches.      Allergies  Latex and Morphine and related  Home Medications   Prior to Admission medications   Medication Sig Start Date End Date Taking? Authorizing Provider  ALPRAZolam Prudy Feeler) 0.25 MG tablet Take 0.25 mg by mouth daily as needed for anxiety.   Yes Historical Provider, MD  cholecalciferol (VITAMIN D) 1000 units tablet Take 1,000 Units by mouth daily.   Yes Historical Provider, MD  ibuprofen (ADVIL,MOTRIN) 200 MG tablet Take 200-800 mg by mouth every 6 (six) hours as needed for headache, mild pain or moderate pain.   Yes Historical Provider, MD  naproxen sodium (ANAPROX) 220 MG tablet Take 400 mg by mouth every 12 (twelve) hours as needed (pain).   Yes Historical Provider, MD  Omega-3 Fatty Acids (FISH OIL) 1000 MG CAPS Take 1,000 mg by mouth daily.   Yes Historical Provider, MD  omeprazole (PRILOSEC OTC) 20 MG tablet Take 20 mg by mouth daily as needed (heart burn).   Yes Historical Provider, MD  polyvinyl alcohol (LIQUIFILM TEARS) 1.4 % ophthalmic solution Place 1 drop into both eyes daily.   Yes Historical Provider, MD  aspirin EC 325 MG tablet Take 1 tablet (325 mg total) by mouth 2 (two) times daily after a meal. Patient not taking: Reported on 05/11/2015 12/18/13   Marshia Ly, PA-C  guaiFENesin-dextromethorphan (ROBITUSSIN DM) 100-10 MG/5ML syrup Take 5  mLs by mouth 3 (three) times daily as needed for cough. 11/18/15   Benjiman CoreNathan Zuhayr Deeney, MD  methocarbamol (ROBAXIN-750) 750 MG tablet Take 1 tablet (750 mg total) by mouth every 8 (eight) hours as needed for muscle spasms. Patient not taking: Reported on 05/11/2015 12/18/13   Marshia LyJames Bethune, PA-C  Multiple Vitamin (MULTIVITAMIN WITH MINERALS) TABS tablet Take 1 tablet by mouth daily.    Historical Provider, MD  oxyCODONE-acetaminophen (PERCOCET/ROXICET) 5-325 MG per tablet Take 1-2 tablets by mouth every 6 (six) hours as needed for severe pain. Patient not taking: Reported on  11/18/2015 05/11/15   Jerelyn ScottMartha Linker, MD  phenylephrine (SUDAFED PE) 10 MG TABS tablet Take 1 tablet (10 mg total) by mouth every 6 (six) hours as needed (congestion). 11/18/15   Benjiman CoreNathan Neave Lenger, MD   BP 132/89 mmHg  Pulse 95  Temp(Src) 98 F (36.7 C) (Oral)  Resp 18  SpO2 97% Physical Exam  Constitutional: She appears well-developed.  HENT:  Mouth/Throat: No oropharyngeal exudate.  Some tenderness over bilateral maxillary sinus. Mild posterior pharyngeal erythema without exudate.  Eyes: EOM are normal. Pupils are equal, round, and reactive to light.  Neck: Normal range of motion.  Cardiovascular: Normal rate.   Pulmonary/Chest: Effort normal and breath sounds normal.  Abdominal: Soft.  Neurological: She is alert. No cranial nerve deficit.    ED Course  Procedures (including critical care time) Labs Review Labs Reviewed - No data to display  Imaging Review No results found. I have personally reviewed and evaluated these images and lab results as part of my medical decision-making.   EKG Interpretation None      MDM   Final diagnoses:  URI (upper respiratory infection)    Patient with likely viral URI. Complaint of double vision but normal exam. Will treat for viral sinusitis. Will give decongested and antitussive prescriptions and will give single dose of sterile it's here.    Benjiman CoreNathan Haliegh Khurana, MD 11/18/15 [phone]

## 2017-03-08 DIAGNOSIS — E7800 Pure hypercholesterolemia, unspecified: Secondary | ICD-10-CM | POA: Diagnosis not present

## 2017-03-08 DIAGNOSIS — R7303 Prediabetes: Secondary | ICD-10-CM | POA: Diagnosis not present

## 2017-03-08 DIAGNOSIS — F339 Major depressive disorder, recurrent, unspecified: Secondary | ICD-10-CM | POA: Diagnosis not present

## 2017-03-08 DIAGNOSIS — E559 Vitamin D deficiency, unspecified: Secondary | ICD-10-CM | POA: Diagnosis not present

## 2017-06-07 IMAGING — CR DG SHOULDER 2+V*R*
3 series · 3 of 3 positions shown · non-contrast
Comparison: None.

CLINICAL DATA: Fall down the stairs 2 days ago. Hyperextended right
shoulder. Generalized right shoulder pain.

EXAM:
RIGHT SHOULDER - 2+ VIEW

[w shoulder external right]
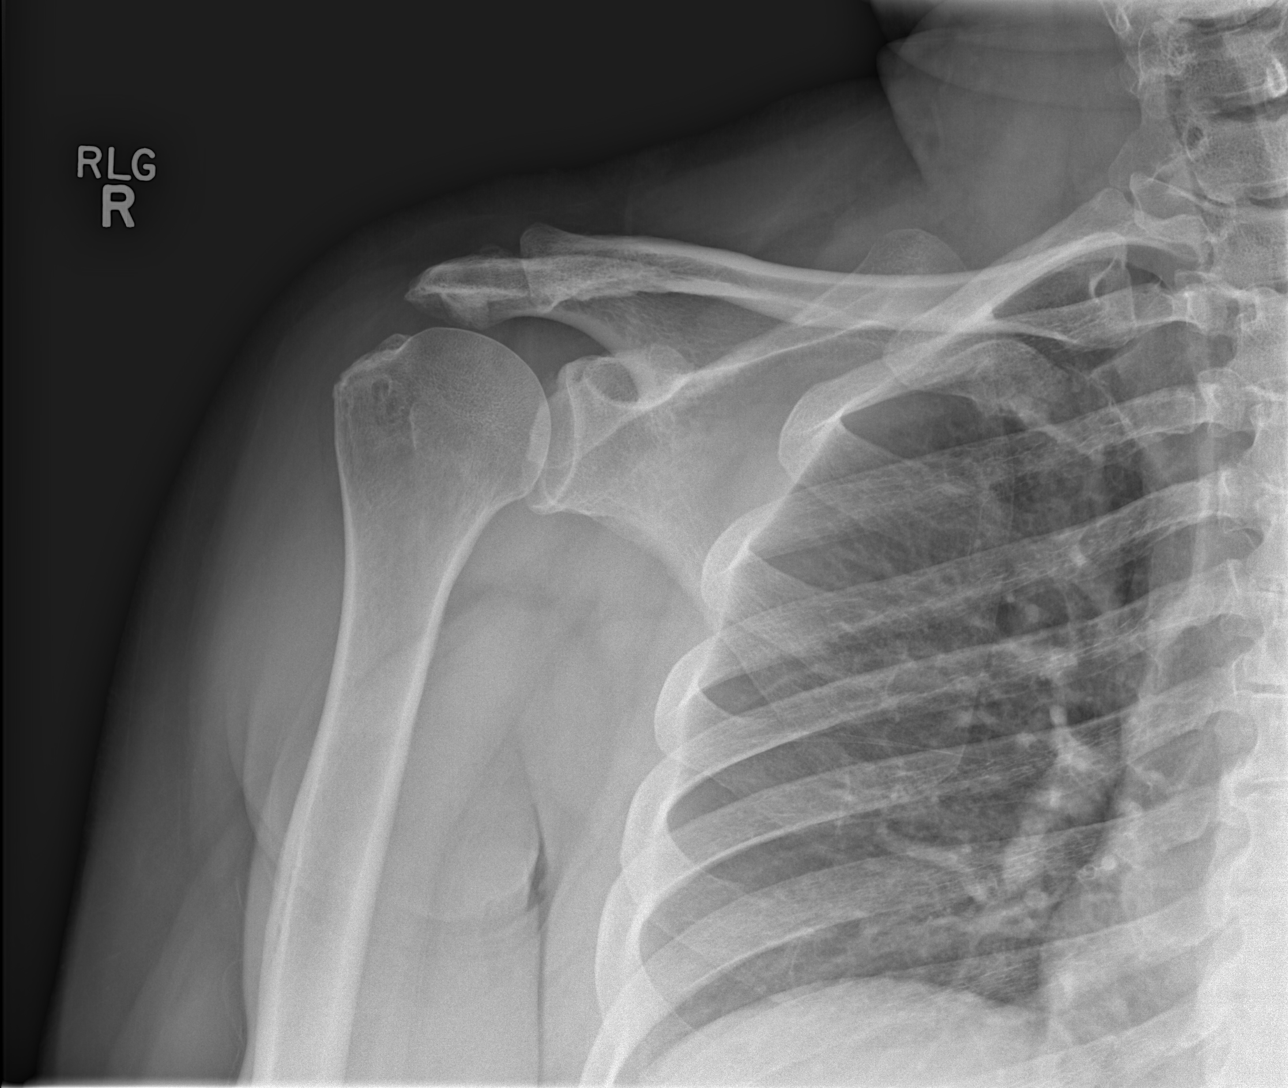

[w shoulder y-view right]
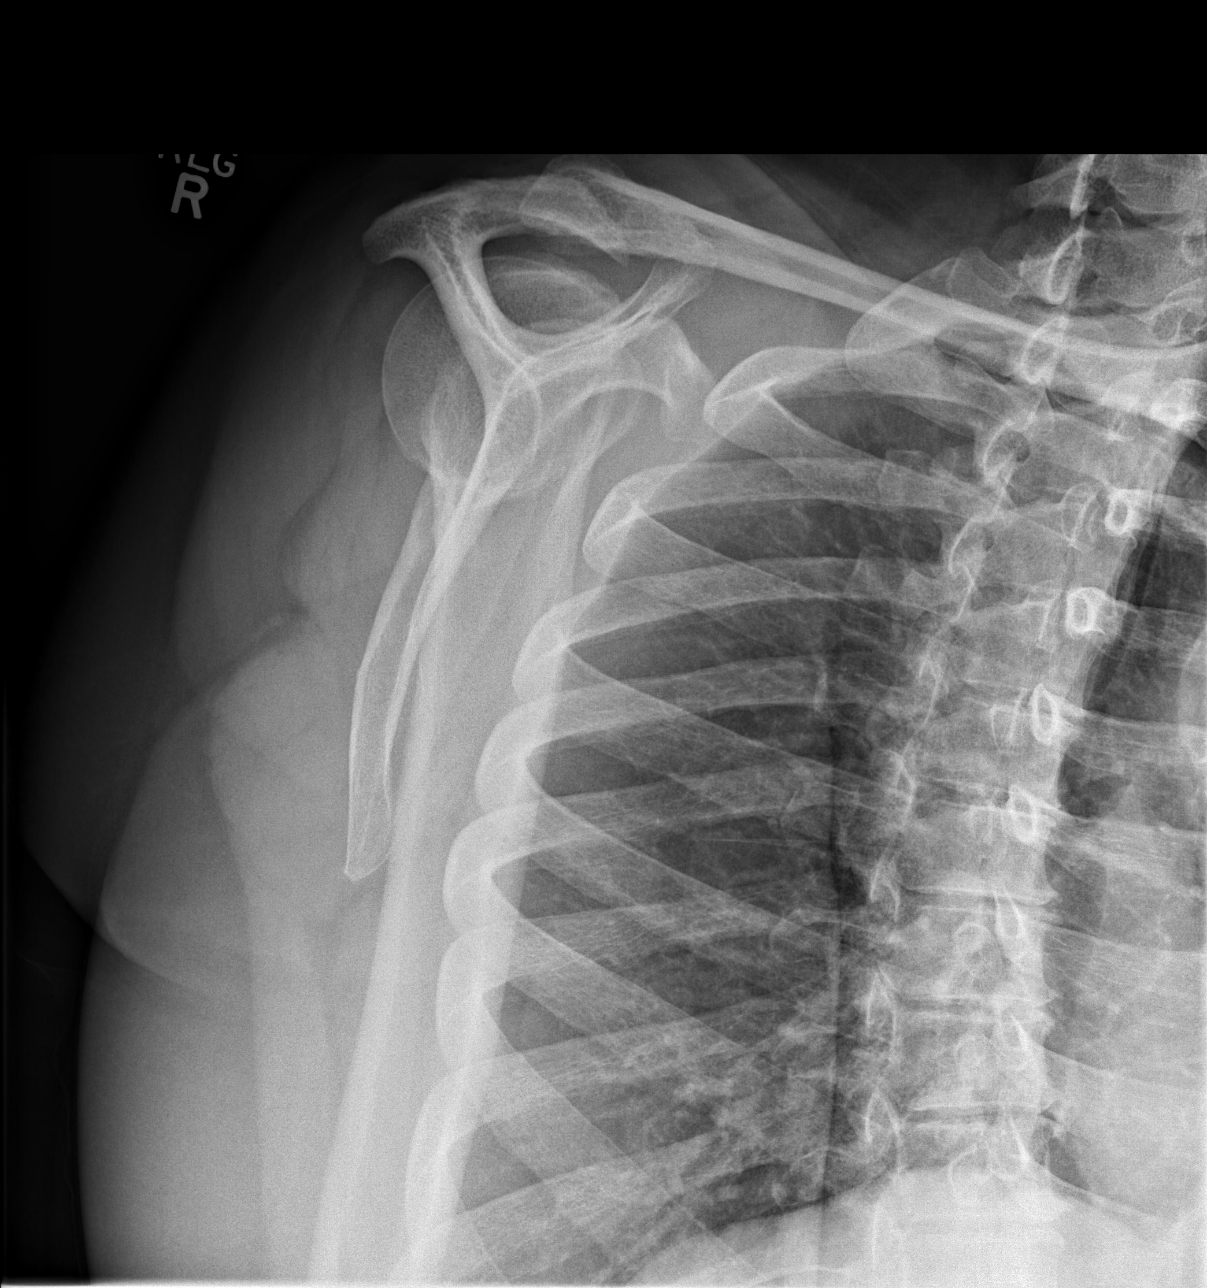

[x shoulder axillary right]
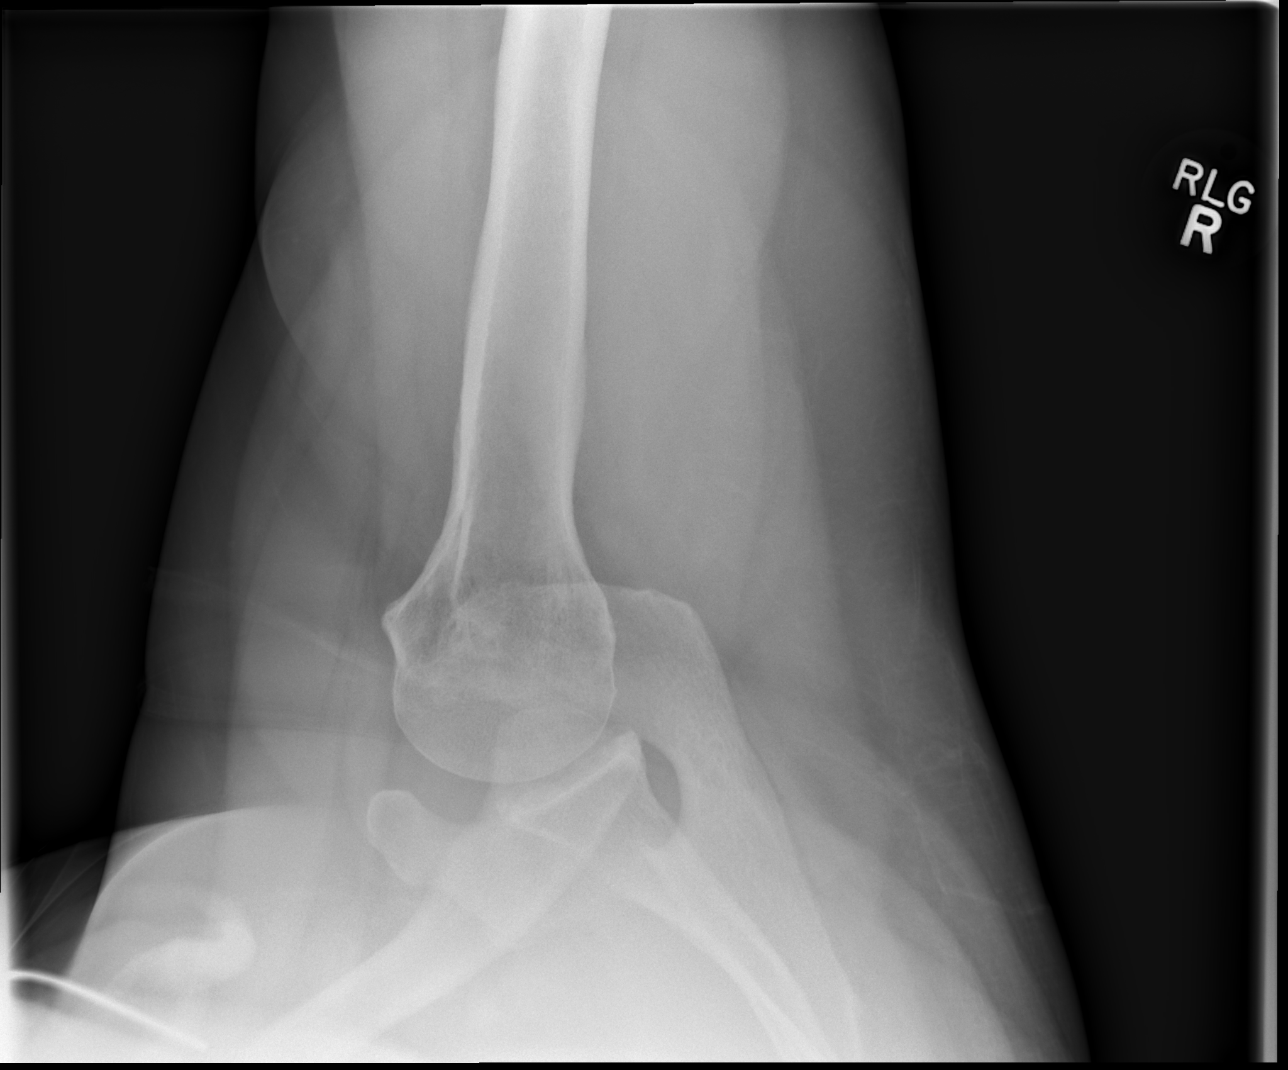

[3 of 3 positions shown; findings below may reference images not displayed]

FINDINGS: Degenerative changes at the right AC joint. Spurring at the rotator
cuff insertion. No acute bony abnormality. Specifically, no
fracture, subluxation, or dislocation. Soft tissues are intact.
IMPRESSION: No acute bony abnormality.

## 2024-05-21 ENCOUNTER — Ambulatory Visit: Payer: Self-pay | Admitting: Family Medicine
